# Patient Record
Sex: Male | Born: 1970 | Race: Black or African American | Hispanic: No | Marital: Married | Smoking: Never smoker
Health system: Southern US, Community
[De-identification: ages and names within clinical notes are randomized; demographics above are authoritative.]

## PROBLEM LIST (undated history)

## (undated) DIAGNOSIS — F419 Anxiety disorder, unspecified: Secondary | ICD-10-CM

## (undated) DIAGNOSIS — T7840XA Allergy, unspecified, initial encounter: Secondary | ICD-10-CM

## (undated) DIAGNOSIS — J45909 Unspecified asthma, uncomplicated: Principal | ICD-10-CM

## (undated) DIAGNOSIS — K219 Gastro-esophageal reflux disease without esophagitis: Secondary | ICD-10-CM

## (undated) DIAGNOSIS — I10 Essential (primary) hypertension: Secondary | ICD-10-CM

## (undated) DIAGNOSIS — R51 Headache: Secondary | ICD-10-CM

## (undated) DIAGNOSIS — L259 Unspecified contact dermatitis, unspecified cause: Secondary | ICD-10-CM

## (undated) HISTORY — DX: Unspecified asthma, uncomplicated: J45.909

## (undated) HISTORY — DX: Gastro-esophageal reflux disease without esophagitis: K21.9

## (undated) HISTORY — DX: Anxiety disorder, unspecified: F41.9

## (undated) HISTORY — DX: Unspecified contact dermatitis, unspecified cause: L25.9

## (undated) HISTORY — PX: WISDOM TOOTH EXTRACTION: SHX21

## (undated) HISTORY — DX: Headache: R51

## (undated) HISTORY — DX: Essential (primary) hypertension: I10

## (undated) HISTORY — DX: Allergy, unspecified, initial encounter: T78.40XA

---

## 1997-09-23 ENCOUNTER — Ambulatory Visit (HOSPITAL_BASED_OUTPATIENT_CLINIC_OR_DEPARTMENT_OTHER): Admission: RE | Admit: 1997-09-23 | Discharge: 1997-09-23 | Payer: Self-pay | Admitting: *Deleted

## 2001-08-29 ENCOUNTER — Encounter: Payer: Self-pay | Admitting: Emergency Medicine

## 2001-08-29 ENCOUNTER — Encounter: Admission: RE | Admit: 2001-08-29 | Discharge: 2001-08-29 | Payer: Self-pay | Admitting: Emergency Medicine

## 2008-02-29 ENCOUNTER — Ambulatory Visit: Payer: Self-pay | Admitting: Internal Medicine

## 2008-02-29 DIAGNOSIS — J45909 Unspecified asthma, uncomplicated: Secondary | ICD-10-CM | POA: Insufficient documentation

## 2008-02-29 DIAGNOSIS — N509 Disorder of male genital organs, unspecified: Secondary | ICD-10-CM | POA: Insufficient documentation

## 2008-02-29 HISTORY — DX: Unspecified asthma, uncomplicated: J45.909

## 2008-03-05 ENCOUNTER — Encounter: Admission: RE | Admit: 2008-03-05 | Discharge: 2008-03-05 | Payer: Self-pay | Admitting: Internal Medicine

## 2009-06-20 ENCOUNTER — Ambulatory Visit: Payer: Self-pay | Admitting: Internal Medicine

## 2009-06-20 DIAGNOSIS — L259 Unspecified contact dermatitis, unspecified cause: Secondary | ICD-10-CM | POA: Insufficient documentation

## 2009-06-20 HISTORY — DX: Unspecified contact dermatitis, unspecified cause: L25.9

## 2009-06-23 ENCOUNTER — Emergency Department (HOSPITAL_COMMUNITY): Admission: EM | Admit: 2009-06-23 | Discharge: 2009-06-23 | Payer: Self-pay | Admitting: Family Medicine

## 2009-06-25 ENCOUNTER — Ambulatory Visit: Payer: Self-pay | Admitting: Internal Medicine

## 2009-06-25 DIAGNOSIS — R51 Headache: Secondary | ICD-10-CM | POA: Insufficient documentation

## 2009-06-25 DIAGNOSIS — R519 Headache, unspecified: Secondary | ICD-10-CM | POA: Insufficient documentation

## 2009-06-25 HISTORY — DX: Headache: R51

## 2009-06-25 LAB — CONVERTED CEMR LAB
Basophils Absolute: 0 10*3/uL (ref 0.0–0.1)
Basophils Relative: 0.3 % (ref 0.0–3.0)
Eosinophils Absolute: 0 10*3/uL (ref 0.0–0.7)
Eosinophils Relative: 0.2 % (ref 0.0–5.0)
HCT: 45.7 % (ref 39.0–52.0)
Hemoglobin: 15.8 g/dL (ref 13.0–17.0)
Lymphocytes Relative: 15.6 % (ref 12.0–46.0)
Lymphs Abs: 1.6 10*3/uL (ref 0.7–4.0)
MCHC: 34.5 g/dL (ref 30.0–36.0)
MCV: 94.2 fL (ref 78.0–100.0)
Monocytes Absolute: 0.8 10*3/uL (ref 0.1–1.0)
Monocytes Relative: 7.3 % (ref 3.0–12.0)
Neutro Abs: 8 10*3/uL — ABNORMAL HIGH (ref 1.4–7.7)
Neutrophils Relative %: 76.6 % (ref 43.0–77.0)
Platelets: 216 10*3/uL (ref 150.0–400.0)
RBC: 4.85 M/uL (ref 4.22–5.81)
RDW: 14 % (ref 11.5–14.6)
WBC: 10.5 10*3/uL (ref 4.5–10.5)

## 2010-02-10 NOTE — Assessment & Plan Note (Signed)
Summary: fever/dizzy/headache/cb   Vital Signs:  Patient profile:   40 year old male Weight:      225 pounds Temp:     98.8 degrees F oral BP sitting:   140 / 80  (right arm) Cuff size:   large  Vitals Entered By: Duard Brady LPN (June 25, 2009 10:11 AM) CC: c/o increase of headache pain  ??migraine Is Patient Diabetic? No   CC:  c/o increase of headache pain  ??migraine.  History of Present Illness: 40 year old patient who presents with a two-day history of headache.  This began at noon time two days ago and has been associated intermittent fever.  He has been using Tylenol and ibuprofen.  This morning had a very difficult time putting his shoes on due to the headaches.  Headaches are frontal and vertex area and seemed to be aggravated by movement.  He denies much delay of stiff neck.  No URI symptoms.  He does have a history of asthma, which has been stable.  At the present time.  He is afebrile and has no pain at rest  Preventive Screening-Counseling & Management  Alcohol-Tobacco     Smoking Status: never  Allergies (verified): No Known Drug Allergies  Past History:  Past Medical History: Reviewed history from 02/29/2008 and no changes required. Asthma testicular nodule  Review of Systems       The patient complains of fever and headaches.  The patient denies anorexia, weight loss, weight gain, vision loss, decreased hearing, hoarseness, chest pain, syncope, dyspnea on exertion, peripheral edema, prolonged cough, hemoptysis, abdominal pain, melena, hematochezia, severe indigestion/heartburn, hematuria, incontinence, genital sores, muscle weakness, suspicious skin lesions, transient blindness, difficulty walking, depression, unusual weight change, abnormal bleeding, enlarged lymph nodes, angioedema, breast masses, and testicular masses.    Physical Exam  General:  no acute distress.  No pain at present vital signs are stable.  Blood pressure 130/82 Head:   Normocephalic and atraumatic without obvious abnormalities. No apparent alopecia or balding. Eyes:  No corneal or conjunctival inflammation noted. EOMI. Perrla. Funduscopic exam benign, without hemorrhages, exudates or papilledema. Vision grossly normal. Ears:  External ear exam shows no significant lesions or deformities.  Otoscopic examination reveals clear canals, tympanic membranes are intact bilaterally without bulging, retraction, inflammation or discharge. Hearing is grossly normal bilaterally. Nose:  External nasal examination shows no deformity or inflammation. Nasal mucosa are pink and moist without lesions or exudates. Mouth:  Oral mucosa and oropharynx without lesions or exudates.  Teeth in good repair. Neck:  No deformities, masses, or tenderness noted. supple with full range of motion.  No meningismus Lungs:  Normal respiratory effort, chest expands symmetrically. Lungs are clear to auscultation, no crackles or wheezes. Heart:  Normal rate and regular rhythm. S1 and S2 normal without gallop, murmur, click, rub or other extra sounds. Abdomen:  Bowel sounds positive,abdomen soft and non-tender without masses, organomegaly or hernias noted.   Impression & Recommendations:  Problem # 1:  HEADACHE (ICD-784.0)  His updated medication list for this problem includes:    Diclofenac Sodium 75 Mg Tbec (Diclofenac sodium) ..... One twice daily probably related to viral syndrome.  Very little to suggest meningitis even a viral meningitis, but fever bothersome.  Will treat symptomatically and follow closely clinically.  Will check a CBC    His updated medication list for this problem includes:    Diclofenac Sodium 75 Mg Tbec (Diclofenac sodium) ..... One twice daily  Problem # 2:  ASTHMA (ICD-493.90)  His updated  medication list for this problem includes:    Proair Hfa 108 (90 Base) Mcg/act Aers (Albuterol sulfate) .Marland Kitchen..Marland Kitchen Two puffs every 6 hours as needed for asthma control  His updated  medication list for this problem includes:    Proair Hfa 108 (90 Base) Mcg/act Aers (Albuterol sulfate) .Marland Kitchen..Marland Kitchen Two puffs every 6 hours as needed for asthma control  Complete Medication List: 1)  Proair Hfa 108 (90 Base) Mcg/act Aers (Albuterol sulfate) .... Two puffs every 6 hours as needed for asthma control 2)  Diclofenac Sodium 75 Mg Tbec (Diclofenac sodium) .... One twice daily  Other Orders: Venipuncture (51884) TLB-CBC Platelet - w/Differential (85025-CBCD)  Patient Instructions: 1)  Take 650-1000mg  of Tylenol every 4-6 hours as needed for relief of pain or comfort of fever AVOID taking more than 4000mg   in a 24 hour period (can cause liver damage in higher doses). 2)  call if the headache  worsens or develop worsening fever, or stiff neck Prescriptions: DICLOFENAC SODIUM 75 MG TBEC (DICLOFENAC SODIUM) one twice daily  #14 x 2   Entered and Authorized by:   Gordy Savers  MD   Signed by:   Gordy Savers  MD on 06/25/2009   Method used:   Electronically to        [facility]  [address] [phone]* (retail)       [address]       [place], [place]  [id number]       Ph: [phone] or [phone]       Fax: [phone]   RxID:   [id number]

## 2010-02-10 NOTE — Assessment & Plan Note (Signed)
Summary: rash/njr   Vital Signs:  Patient profile:   40 year old male Weight:      228 pounds Temp:     98.0 degrees F oral BP sitting:   120 / 78  (right arm) Cuff size:   regular  Vitals Entered By: Duard Brady LPN (June 20, 2009 2:33 PM) CC: c/o rash to grion area x 1 wk - also removed a tick in same area Is Patient Diabetic? No   CC:  c/o rash to grion area x 1 wk - also removed a tick in same area.  History of Present Illness: 40 year old patient who has noted a 5 day rash involving his penile shaft.  One week prior.  He did remove a tic from the scrotal area.  He and his wife use condoms for birth control measures and he has used a new brand.  The rash is nonpruritic nonpainful and he states that he would not know its presence except visually.  It has really been unchanged since he first noted it about 5 days ago.  There is been no fever, inguinal adenopathy.  History of STD  Preventive Screening-Counseling & Management  Alcohol-Tobacco     Smoking Status: never  Allergies (verified): No Known Drug Allergies  Past History:  Past Medical History: Reviewed history from 02/29/2008 and no changes required. Asthma testicular nodule  Review of Systems       The patient complains of suspicious skin lesions.    Physical Exam  General:  overweight-appearing.  anxious normal blood pressure Abdomen:  scattered small 1-2 mm shallow nonpainful noninflamed ulcerations are noted involving the penile shaft near the head of the penis   Impression & Recommendations:  Problem # 1:  DERMATITIS (ICD-692.9) I believe this most likely represents a contact dermatitis related to the recent use of a change in his lubricated condoms; will clinically observe at this time  Complete Medication List: 1)  Proair Hfa 108 (90 Base) Mcg/act Aers (Albuterol sulfate) .... Two puffs every 6 hours as needed for asthma control  Patient Instructions: 1)  Please schedule a follow-up  appointment as needed.

## 2010-03-30 LAB — POCT URINALYSIS DIP (DEVICE)
Bilirubin Urine: NEGATIVE
Glucose, UA: NEGATIVE mg/dL
Ketones, ur: NEGATIVE mg/dL
Nitrite: NEGATIVE
Protein, ur: NEGATIVE mg/dL
Specific Gravity, Urine: 1.025 (ref 1.005–1.030)
Urobilinogen, UA: 0.2 mg/dL (ref 0.0–1.0)
pH: 6.5 (ref 5.0–8.0)

## 2010-05-14 ENCOUNTER — Encounter: Payer: Self-pay | Admitting: Internal Medicine

## 2010-05-14 ENCOUNTER — Ambulatory Visit (INDEPENDENT_AMBULATORY_CARE_PROVIDER_SITE_OTHER): Payer: 59 | Admitting: Internal Medicine

## 2010-05-14 DIAGNOSIS — J45909 Unspecified asthma, uncomplicated: Secondary | ICD-10-CM

## 2010-05-14 MED ORDER — ALBUTEROL SULFATE HFA 108 (90 BASE) MCG/ACT IN AERS: 2.0000 | INHALATION_SPRAY | Freq: Four times a day (QID) | RESPIRATORY_TRACT | Status: DC | PRN

## 2010-05-14 NOTE — Patient Instructions (Signed)
Call or return to clinic prn if these symptoms worsen or fail to improve as anticipated.

## 2010-05-14 NOTE — Progress Notes (Signed)
  Subjective:    Patient ID: Carlos Burns, male    DOB: [birth year], 40 y.o.   MRN: [id number]  HPI 40 year old patient who is seen today for followup. He has a history of fairly mild episodic asthma that is generally well controlled on when necessary rescue albuterol. He apparently has periods her in the spring and also during URI where he has lower chronic symptoms. At the present time he is doing fairly well. He states that he has used his inhaler on 3 occasions this year    Review of Systems  Constitutional: Negative for fever, chills, appetite change and fatigue.  HENT: Negative for hearing loss, ear pain, congestion, sore throat, trouble swallowing, neck stiffness, dental problem, voice change and tinnitus.   Eyes: Negative for pain, discharge and visual disturbance.  Respiratory: Negative for cough, chest tightness, wheezing and stridor.   Cardiovascular: Negative for chest pain, palpitations and leg swelling.  Gastrointestinal: Negative for nausea, vomiting, abdominal pain, diarrhea, constipation, blood in stool and abdominal distention.  Genitourinary: Negative for urgency, hematuria, flank pain, discharge, difficulty urinating and genital sores.  Musculoskeletal: Negative for myalgias, back pain, joint swelling, arthralgias and gait problem.  Skin: Negative for rash.  Neurological: Negative for dizziness, syncope, speech difficulty, weakness, numbness and headaches.  Hematological: Negative for adenopathy. Does not bruise/bleed easily.  Psychiatric/Behavioral: Negative for behavioral problems and dysphoric mood. The patient is not nervous/anxious.        Objective:   Physical Exam  Constitutional: He is oriented to person, place, and time. He appears well-developed.  HENT:  Head: Normocephalic.  Right Ear: External ear normal.  Left Ear: External ear normal.  Eyes: Conjunctivae and EOM are normal.  Neck: Normal range of motion.  Cardiovascular: Normal rate and normal  heart sounds.   Pulmonary/Chest: Breath sounds normal. No respiratory distress. He has no wheezes. He has no rales.  Abdominal: Bowel sounds are normal.  Musculoskeletal: Normal range of motion. He exhibits no edema and no tenderness.  Neurological: He is alert and oriented to person, place, and time.  Psychiatric: He has a normal mood and affect. His behavior is normal.          Assessment & Plan:   Asthma stable. His albuterol was refilled. He was given samples of Flovent to use sporadically for increase in seasonal symptoms

## 2010-05-25 ENCOUNTER — Inpatient Hospital Stay (INDEPENDENT_AMBULATORY_CARE_PROVIDER_SITE_OTHER)
Admission: RE | Admit: 2010-05-25 | Discharge: 2010-05-25 | Disposition: A | Discharge status: Home / Self Care | Payer: 59 | Source: Ambulatory Visit

## 2010-05-25 DIAGNOSIS — J019 Acute sinusitis, unspecified: Secondary | ICD-10-CM

## 2011-06-29 ENCOUNTER — Encounter: Payer: Self-pay | Admitting: Internal Medicine

## 2011-06-29 ENCOUNTER — Ambulatory Visit (INDEPENDENT_AMBULATORY_CARE_PROVIDER_SITE_OTHER): Payer: 59 | Admitting: Internal Medicine

## 2011-06-29 VITALS — BP 126/70 | Wt 250.0 lb

## 2011-06-29 DIAGNOSIS — J45909 Unspecified asthma, uncomplicated: Secondary | ICD-10-CM

## 2011-06-29 MED ORDER — ALBUTEROL SULFATE HFA 108 (90 BASE) MCG/ACT IN AERS: 2.0000 | INHALATION_SPRAY | Freq: Four times a day (QID) | RESPIRATORY_TRACT | Status: DC | PRN

## 2011-06-29 MED ORDER — FLUTICASONE-SALMETEROL 100-50 MCG/DOSE IN AEPB: 1.0000 | INHALATION_SPRAY | Freq: Two times a day (BID) | RESPIRATORY_TRACT | Status: DC

## 2011-06-29 MED ORDER — PREDNISONE 20 MG PO TABS: 20.0000 mg | ORAL_TABLET | Freq: Two times a day (BID) | ORAL | Status: AC

## 2011-06-29 NOTE — Progress Notes (Signed)
  Subjective:    Patient ID: Carlos Burns, male    DOB: Feb 13, 1970, 41 y.o.   MRN: [id number]  HPI  41 year old patient who has a history of asthma. He presents with a two-week history of increasing shortness of breath especially late in the afternoon and early evening. His chest tightness and shortness of breath alleviated by albuterol. He does note some occasional wheezing at night    Review of Systems  Constitutional: Negative for fever, chills, appetite change and fatigue.  HENT: Negative for hearing loss, ear pain, congestion, sore throat, trouble swallowing, neck stiffness, dental problem, voice change and tinnitus.   Eyes: Negative for pain, discharge and visual disturbance.  Respiratory: Positive for chest tightness and wheezing. Negative for cough and stridor.   Cardiovascular: Negative for chest pain, palpitations and leg swelling.  Gastrointestinal: Negative for nausea, vomiting, abdominal pain, diarrhea, constipation, blood in stool and abdominal distention.  Genitourinary: Negative for urgency, hematuria, flank pain, discharge, difficulty urinating and genital sores.  Musculoskeletal: Negative for myalgias, back pain, joint swelling, arthralgias and gait problem.  Skin: Negative for rash.  Neurological: Negative for dizziness, syncope, speech difficulty, weakness, numbness and headaches.  Hematological: Negative for adenopathy. Does not bruise/bleed easily.  Psychiatric/Behavioral: Negative for behavioral problems and dysphoric mood. The patient is not nervous/anxious.        Objective:   Physical Exam  Constitutional: He is oriented to person, place, and time. He appears well-developed.  HENT:  Head: Normocephalic.  Right Ear: External ear normal.  Left Ear: External ear normal.  Eyes: Conjunctivae and EOM are normal.  Neck: Normal range of motion.  Cardiovascular: Normal rate and normal heart sounds.   Pulmonary/Chest: Effort normal and breath sounds normal. No  respiratory distress. He has no wheezes. He has no rales.       O2 saturation 98%  Abdominal: Bowel sounds are normal.  Musculoskeletal: Normal range of motion. He exhibits no edema and no tenderness.  Neurological: He is alert and oriented to person, place, and time.  Psychiatric: He has a normal mood and affect. His behavior is normal.          Assessment & Plan:   Mild persistent asthma. We'll treat with prednisone for 5 days. We'll resume Advair continue albuterol when necessary. Will call if unimproved or incomplete symptom control

## 2011-06-29 NOTE — Patient Instructions (Signed)
Call or return to clinic prn if these symptoms worsen or fail to improve as anticipated.

## 2011-10-13 ENCOUNTER — Encounter: Payer: Self-pay | Admitting: Family Medicine

## 2011-10-13 ENCOUNTER — Ambulatory Visit (INDEPENDENT_AMBULATORY_CARE_PROVIDER_SITE_OTHER): Payer: 59 | Admitting: Family Medicine

## 2011-10-13 VITALS — BP 146/94 | HR 93 | Temp 98.6°F | Wt 246.0 lb

## 2011-10-13 DIAGNOSIS — I10 Essential (primary) hypertension: Secondary | ICD-10-CM

## 2011-10-13 LAB — BASIC METABOLIC PANEL
BUN: 13 mg/dL (ref 6–23)
Creatinine, Ser: 1.2 mg/dL (ref 0.4–1.5)
GFR: 84.74 mL/min (ref >60.00)
Potassium: 3.5 mEq/L (ref 3.5–5.1)

## 2011-10-13 LAB — CBC WITH DIFFERENTIAL/PLATELET
Basophils Relative: 0.4 % (ref 0.0–3.0)
Hemoglobin: 15.3 g/dL (ref 13.0–17.0)
Lymphocytes Relative: 19.1 % (ref 12.0–46.0)
Monocytes Relative: 7.3 % (ref 3.0–12.0)
Neutro Abs: 6.3 10*3/uL (ref 1.4–7.7)
RBC: 4.93 Mil/uL (ref 4.22–5.81)

## 2011-10-13 LAB — POCT URINALYSIS DIPSTICK
Bilirubin, UA: NEGATIVE
Ketones, UA: NEGATIVE
Leukocytes, UA: NEGATIVE

## 2011-10-13 LAB — HEPATIC FUNCTION PANEL
AST: 25 U/L (ref 0–37)
Total Bilirubin: 0.7 mg/dL (ref 0.3–1.2)

## 2011-10-13 MED ORDER — LISINOPRIL-HYDROCHLOROTHIAZIDE 10-12.5 MG PO TABS: 1.0000 | ORAL_TABLET | Freq: Every day | ORAL | Status: DC

## 2011-10-13 NOTE — Progress Notes (Signed)
  Subjective:    Patient ID: Carlos Burns, male    DOB: [birth year], 41 y.o.   MRN: [id number]  HPI Here for elevated BP readings and some symptoms. He has had high BP readings off and on in the past, but this was normal when he was here last June. [place] [employer] [place] this [place], and his initial BP was 160/100 in one arm and 170/110 in the other arm. He admits to having some intermittent lightheadedness and HAs for the past few months. He also described tingling in both hands and both feet. No chest pains or SOB. His family hx is positive for HTN and diabetes in his parents and multiple relatives. He does not use tobacco. After he rested for awhile at the fair this morning, his repeat pressures were in the range of 150s over 90s.    Review of Systems  Constitutional: Negative.   Eyes: Negative.   Respiratory: Negative.   Cardiovascular: Negative.   Neurological: Positive for light-headedness, numbness and headaches. Negative for dizziness.       Objective:   Physical Exam  Constitutional: He is oriented to person, place, and time. No distress.       Overweight   Neck: No thyromegaly present.  Cardiovascular: Normal rate, regular rhythm, normal heart sounds and intact distal pulses.  Exam reveals no gallop and no friction rub.   No murmur heard. Pulmonary/Chest: Effort normal and breath sounds normal. No respiratory distress. He has no wheezes. He has no rales.  Musculoskeletal: He exhibits no edema.  Lymphadenopathy:    He has no cervical adenopathy.  Neurological: He is alert and oriented to person, place, and time.          Assessment & Plan:  New diagnosis of HTN. Start on Lisinopril HCT. Get labs today. We discussed reducing his sodium intake, getting more exercise, and losing weight. Recheck with Dr. Amador Cunas in one month.

## 2011-10-14 LAB — TSH: TSH: 2.66 u[IU]/mL (ref 0.35–5.50)

## 2011-10-15 NOTE — Progress Notes (Signed)
Quick Note:  I left voice message with normal results. ______ 

## 2012-02-26 ENCOUNTER — Other Ambulatory Visit: Payer: Self-pay

## 2012-11-16 ENCOUNTER — Other Ambulatory Visit: Payer: Self-pay

## 2012-11-21 ENCOUNTER — Ambulatory Visit (INDEPENDENT_AMBULATORY_CARE_PROVIDER_SITE_OTHER): Payer: 59 | Admitting: Internal Medicine

## 2012-11-21 ENCOUNTER — Encounter: Payer: Self-pay | Admitting: Internal Medicine

## 2012-11-21 VITALS — BP 150/100 | HR 81 | Temp 98.2°F | Resp 20 | Wt 246.0 lb

## 2012-11-21 DIAGNOSIS — M5010 Cervical disc disorder with radiculopathy, unspecified cervical region: Secondary | ICD-10-CM

## 2012-11-21 DIAGNOSIS — M5412 Radiculopathy, cervical region: Secondary | ICD-10-CM

## 2012-11-21 DIAGNOSIS — I10 Essential (primary) hypertension: Secondary | ICD-10-CM | POA: Insufficient documentation

## 2012-11-21 MED ORDER — METHYLPREDNISOLONE ACETATE 80 MG/ML IJ SUSP
80.0000 mg | Freq: Once | INTRAMUSCULAR | Status: AC
  Administered 2012-11-21: 80 mg via INTRAMUSCULAR

## 2012-11-21 MED ORDER — LISINOPRIL-HYDROCHLOROTHIAZIDE 10-12.5 MG PO TABS: 1.0000 | ORAL_TABLET | Freq: Every day | ORAL | Status: DC

## 2012-11-21 NOTE — Addendum Note (Signed)
Addended by: Jimmye Norman on: 11/21/2012 05:30 PM   Modules accepted: Orders

## 2012-11-21 NOTE — Patient Instructions (Addendum)
Limit your sodium (Salt) intake  Cervical Strain and Sprain (Whiplash) with Rehab Cervical strain and sprains are injuries that commonly occur with "whiplash" injuries. Whiplash occurs when the neck is forcefully whipped backward or forward, such as during a motor vehicle accident. The muscles, ligaments, tendons, discs and nerves of the neck are susceptible to injury when this occurs. SYMPTOMS   Pain or stiffness in the front and/or back of neck  Symptoms may present immediately or up to 24 hours after injury.  Dizziness, headache, nausea and vomiting.  Muscle spasm with soreness and stiffness in the neck.  Tenderness and swelling at the injury site. CAUSES  Whiplash injuries often occur during contact sports or motor vehicle accidents.  RISK INCREASES WITH:  Osteoarthritis of the spine.  Situations that make head or neck accidents or trauma more likely.  High-risk sports (football, rugby, wrestling, hockey, auto racing, gymnastics, diving, contact karate or boxing).  Poor strength and flexibility of the neck.  Previous neck injury.  Poor tackling technique.  Improperly fitted or padded equipment. PREVENTION  Learn and use proper technique (avoid tackling with the head, spearing and head-butting; use proper falling techniques to avoid landing on the head).  Warm up and stretch properly before activity.  Maintain physical fitness:  Strength, flexibility and endurance.  Cardiovascular fitness.  Wear properly fitted and padded protective equipment, such as padded soft collars, for participation in contact sports. PROGNOSIS  Recovery for cervical strain and sprain injuries is dependent on the extent of the injury. These injuries are usually curable in 1 week to 3 months with appropriate treatment.  RELATED COMPLICATIONS   Temporary numbness and weakness may occur if the nerve roots are damaged, and this may persist until the nerve has completely healed.  Chronic pain  due to frequent recurrence of symptoms.  Prolonged healing, especially if activity is resumed too soon (before complete recovery). TREATMENT  Treatment initially involves the use of ice and medication to help reduce pain and inflammation. It is also important to perform strengthening and stretching exercises and modify activities that worsen symptoms so the injury does not get worse. These exercises may be performed at home or with a therapist. For patients who experience severe symptoms, a soft padded collar may be recommended to be worn around the neck.  Improving your posture may help reduce symptoms. Posture improvement includes pulling your chin and abdomen in while sitting or standing. If you are sitting, sit in a firm chair with your buttocks against the back of the chair. While sleeping, try replacing your pillow with a small towel rolled to 2 inches in diameter, or use a cervical pillow or soft cervical collar. Poor sleeping positions delay healing.  For patients with nerve root damage, which causes numbness or weakness, the use of a cervical traction apparatus may be recommended. Surgery is rarely necessary for these injuries. However, cervical strain and sprains that are present at birth (congenital) may require surgery. MEDICATION   If pain medication is necessary, nonsteroidal anti-inflammatory medications, such as aspirin and ibuprofen, or other minor pain relievers, such as acetaminophen, are often recommended.  Do not take pain medication for 7 days before surgery.  Prescription pain relievers may be given if deemed necessary by your caregiver. Use only as directed and only as much as you need. HEAT AND COLD:   Cold treatment (icing) relieves pain and reduces inflammation. Cold treatment should be applied for 10 to 15 minutes every 2 to 3 hours for inflammation and pain  and immediately after any activity that aggravates your symptoms. Use ice packs or an ice massage.  Heat treatment  may be used prior to performing the stretching and strengthening activities prescribed by your caregiver, physical therapist, or athletic trainer. Use a heat pack or a warm soak. SEEK MEDICAL CARE IF:   Symptoms get worse or do not improve in 2 weeks despite treatment.  New, unexplained symptoms develop (drugs used in treatment may produce side effects). EXERCISES RANGE OF MOTION (ROM) AND STRETCHING EXERCISES - Cervical Strain and Sprain These exercises may help you when beginning to rehabilitate your injury. In order to successfully resolve your symptoms, you must improve your posture. These exercises are designed to help reduce the forward-head and rounded-shoulder posture which contributes to this condition. Your symptoms may resolve with or without further involvement from your physician, physical therapist or athletic trainer. While completing these exercises, remember:   Restoring tissue flexibility helps normal motion to return to the joints. This allows healthier, less painful movement and activity.  An effective stretch should be held for at least 20 seconds, although you may need to begin with shorter hold times for comfort.  A stretch should never be painful. You should only feel a gentle lengthening or release in the stretched tissue. STRETCH- Axial Extensors  Lie on your back on the floor. You may bend your knees for comfort. Place a rolled up hand towel or dish towel, about 2 inches in diameter, under the part of your head that makes contact with the floor.  Gently tuck your chin, as if trying to make a "double chin," until you feel a gentle stretch at the base of your head.  Hold __________ seconds. Repeat __________ times. Complete this exercise __________ times per day.  STRETECH - Axial Extension   Stand or sit on a firm surface. Assume a good posture: chest up, shoulders drawn back, abdominal muscles slightly tense, knees unlocked (if standing) and feet hip width  apart.  Slowly retract your chin so your head slides back and your chin slightly lowers.Continue to look straight ahead.  You should feel a gentle stretch in the back of your head. Be certain not to feel an aggressive stretch since this can cause headaches later.  Hold for __________ seconds. Repeat __________ times. Complete this exercise __________ times per day. STRETCH  Cervical Side Bend   Stand or sit on a firm surface. Assume a good posture: chest up, shoulders drawn back, abdominal muscles slightly tense, knees unlocked (if standing) and feet hip width apart.  Without letting your nose or shoulders move, slowly tip your right / left ear to your shoulder until your feel a gentle stretch in the muscles on the opposite side of your neck.  Hold __________ seconds. Repeat __________ times. Complete this exercise __________ times per day. STRETCH  Cervical Rotators   Stand or sit on a firm surface. Assume a good posture: chest up, shoulders drawn back, abdominal muscles slightly tense, knees unlocked (if standing) and feet hip width apart.  Keeping your eyes level with the ground, slowly turn your head until you feel a gentle stretch along the back and opposite side of your neck.  Hold __________ seconds. Repeat __________ times. Complete this exercise __________ times per day. RANGE OF MOTION - Neck Circles   Stand or sit on a firm surface. Assume a good posture: chest up, shoulders drawn back, abdominal muscles slightly tense, knees unlocked (if standing) and feet hip width apart.  Gently roll  your head down and around from the back of one shoulder to the back of the other. The motion should never be forced or painful.  Repeat the motion 10-20 times, or until you feel the neck muscles relax and loosen. Repeat __________ times. Complete the exercise __________ times per day. STRENGTHENING EXERCISES - Cervical Strain and Sprain These exercises may help you when beginning to  rehabilitate your injury. They may resolve your symptoms with or without further involvement from your physician, physical therapist or athletic trainer. While completing these exercises, remember:   Muscles can gain both the endurance and the strength needed for everyday activities through controlled exercises.  Complete these exercises as instructed by your physician, physical therapist or athletic trainer. Progress the resistance and repetitions only as guided.  You may experience muscle soreness or fatigue, but the pain or discomfort you are trying to eliminate should never worsen during these exercises. If this pain does worsen, stop and make certain you are following the directions exactly. If the pain is still present after adjustments, discontinue the exercise until you can discuss the trouble with your clinician. STRENGTH Cervical Flexors, Isometric  Face a wall, standing about 6 inches away. Place a small pillow, a ball about 6-8 inches in diameter, or a folded towel between your forehead and the wall.  Slightly tuck your chin and gently push your forehead into the soft object. Push only with mild to moderate intensity, building up tension gradually. Keep your jaw and forehead relaxed.  Hold 10 to 20 seconds. Keep your breathing relaxed.  Release the tension slowly. Relax your neck muscles completely before you start the next repetition. Repeat __________ times. Complete this exercise __________ times per day. STRENGTH- Cervical Lateral Flexors, Isometric   Stand about 6 inches away from a wall. Place a small pillow, a ball about 6-8 inches in diameter, or a folded towel between the side of your head and the wall.  Slightly tuck your chin and gently tilt your head into the soft object. Push only with mild to moderate intensity, building up tension gradually. Keep your jaw and forehead relaxed.  Hold 10 to 20 seconds. Keep your breathing relaxed.  Release the tension slowly. Relax  your neck muscles completely before you start the next repetition. Repeat __________ times. Complete this exercise __________ times per day. STRENGTH  Cervical Extensors, Isometric   Stand about 6 inches away from a wall. Place a small pillow, a ball about 6-8 inches in diameter, or a folded towel between the back of your head and the wall.  Slightly tuck your chin and gently tilt your head back into the soft object. Push only with mild to moderate intensity, building up tension gradually. Keep your jaw and forehead relaxed.  Hold 10 to 20 seconds. Keep your breathing relaxed.  Release the tension slowly. Relax your neck muscles completely before you start the next repetition. Repeat __________ times. Complete this exercise __________ times per day. POSTURE AND BODY MECHANICS CONSIDERATIONS - Cervical Strain and Sprain Keeping correct posture when sitting, standing or completing your activities will reduce the stress put on different body tissues, allowing injured tissues a chance to heal and limiting painful experiences. The following are general guidelines for improved posture. Your physician or physical therapist will provide you with any instructions specific to your needs. While reading these guidelines, remember:  The exercises prescribed by your provider will help you have the flexibility and strength to maintain correct postures.  The correct posture provides the  optimal environment for your joints to work. All of your joints have less wear and tear when properly supported by a spine with good posture. This means you will experience a healthier, less painful body.  Correct posture must be practiced with all of your activities, especially prolonged sitting and standing. Correct posture is as important when doing repetitive low-stress activities (typing) as it is when doing a single heavy-load activity (lifting). PROLONGED STANDING WHILE SLIGHTLY LEANING FORWARD When completing a task that  requires you to lean forward while standing in one place for a long time, place either foot up on a stationary 2-4 inch high object to help maintain the best posture. When both feet are on the ground, the low back tends to lose its slight inward curve. If this curve flattens (or becomes too large), then the back and your other joints will experience too much stress, fatigue more quickly and can cause pain.  RESTING POSITIONS Consider which positions are most painful for you when choosing a resting position. If you have pain with flexion-based activities (sitting, bending, stooping, squatting), choose a position that allows you to rest in a less flexed posture. You would want to avoid curling into a fetal position on your side. If your pain worsens with extension-based activities (prolonged standing, working overhead), avoid resting in an extended position such as sleeping on your stomach. Most people will find more comfort when they rest with their spine in a more neutral position, neither too rounded nor too arched. Lying on a non-sagging bed on your side with a pillow between your knees, or on your back with a pillow under your knees will often provide some relief. Keep in mind, being in any one position for a prolonged period of time, no matter how correct your posture, can still lead to stiffness. WALKING Walk with an upright posture. Your ears, shoulders and hips should all line-up. OFFICE WORK When working at a desk, create an environment that supports good, upright posture. Without extra support, muscles fatigue and lead to excessive strain on joints and other tissues. CHAIR:  A chair should be able to slide under your desk when your back makes contact with the back of the chair. This allows you to work closely.  The chair's height should allow your eyes to be level with the upper part of your monitor and your hands to be slightly lower than your elbows.  Body position:  Your feet should make  contact with the floor. If this is not possible, use a foot rest.  Keep your ears over your shoulders. This will reduce stress on your neck and low back. Document Released: 12/28/2004 Document Revised: 04/24/2012 Document Reviewed: 04/11/2008 [facility] Patient Information 2014 [place], [place].  CPX in January as scheduled

## 2012-11-21 NOTE — Progress Notes (Signed)
Subjective:    Patient ID: Carlos Burns, male    DOB: August 14, 1970, 42 y.o.   MRN: [id number]  HPI Pre-visit discussion using our clinic review tool. No additional management support is needed unless otherwise documented below in the visit note.    BP Readings from Last 3 Encounters:  11/21/12 150/100  10/13/11 146/94  06/29/11 55/15    42 year old patient who is seen today with a chief complaint of right arm pain and paresthesias. He states this has been present since the spring. Pain is aggravated by movement of the head and neck to the right. It also seems to be aggravated by raising the right arm. He works in [employer]ing [employer] and does considerable amount of typing.  He was placed on combination antihypertensive blood pressure medication recently but has been off the for several weeks. Blood pressure is moderately high today and blood pressure medications will be resumed  Past Medical History  Diagnosis Date  . ASTHMA 02/29/2008  . DERMATITIS 06/20/2009  . Headache(784.0) 06/25/2009    History   Social History  . Marital Status: Married    Spouse Name: N/A    Number of Children: N/A  . Years of Education: N/A   Occupational History  . Not on file.   Social History Main Topics  . Smoking status: Never Smoker   . Smokeless tobacco: Never Used  . Alcohol Use: Yes  . Drug Use: No  . Sexual Activity: Not on file   Other Topics Concern  . Not on file   Social History Narrative  . No narrative on file    History reviewed. No pertinent past surgical history.  Family History  Problem Relation Age of Onset  . Hypertension Father   . Stroke Maternal Grandmother   . Hypertension Maternal Grandmother   . Stroke Maternal Grandfather   . Diabetes Maternal Grandfather   . Stroke Paternal Grandmother   . Diabetes Paternal Grandmother   . Hypertension Paternal Grandmother   . Diabetes Paternal Grandfather   . Hypertension Paternal Grandfather     No Known Allergies  Current  Outpatient Prescriptions on File Prior to Visit  Medication Sig Dispense Refill  . albuterol (PROAIR HFA) 108 (90 BASE) MCG/ACT inhaler Inhale 2 puffs into the lungs every 6 (six) hours as needed. For asthma  1 Inhaler  6  . cetirizine (ZYRTEC) 10 MG tablet Take 10 mg by mouth as needed.       . Fluticasone-Salmeterol (ADVAIR) 100-50 MCG/DOSE AEPB Inhale 1 puff into the lungs as needed.       No current facility-administered medications on file prior to visit.    BP 150/100  Pulse 81  Temp(Src) 98.2 F (36.8 C) (Oral)  Resp 20  Wt 246 lb (111.585 kg)  SpO2 99%     Review of Systems  Constitutional: Negative for fever, chills, appetite change and fatigue.  HENT: Negative for congestion, dental problem, ear pain, hearing loss, sore throat, tinnitus, trouble swallowing and voice change.   Eyes: Negative for pain, discharge and visual disturbance.  Respiratory: Negative for cough, chest tightness, wheezing and stridor.   Cardiovascular: Negative for chest pain, palpitations and leg swelling.  Gastrointestinal: Negative for nausea, vomiting, abdominal pain, diarrhea, constipation, blood in stool and abdominal distention.  Genitourinary: Negative for urgency, hematuria, flank pain, discharge, difficulty urinating and genital sores.  Musculoskeletal: Negative for arthralgias, back pain, gait problem, joint swelling, myalgias and neck stiffness.  Skin: Negative for rash.  Neurological: Positive for numbness. Negative  for dizziness, syncope, speech difficulty, weakness and headaches.  Hematological: Negative for adenopathy. Does not bruise/bleed easily.  Psychiatric/Behavioral: Negative for behavioral problems and dysphoric mood. The patient is not nervous/anxious.        Objective:   Physical Exam  Constitutional: He is oriented to person, place, and time. He appears well-developed and well-nourished. No distress.  Blood pressure 150/100  HENT:  Head: Normocephalic.  Right Ear:  External ear normal.  Left Ear: External ear normal.  Eyes: Conjunctivae and EOM are normal.  Neck: Normal range of motion.  Cardiovascular: Normal rate and normal heart sounds.   Pulmonary/Chest: Breath sounds normal.  Abdominal: Bowel sounds are normal.  Musculoskeletal: Normal range of motion. He exhibits no edema and no tenderness.  Movement of head neck to the right did tend to aggravate the paresthesias involving his right lower arm Grip strength was normal on flexion and extension normal Biceps and triceps reflexes normal  Negative Tinel's  Neurological: He is alert and oriented to person, place, and time.  Psychiatric: He has a normal mood and affect. His behavior is normal.          Assessment & Plan:   Right arm and shoulder pain with paresthesias of the right lower arm and  involving the second and third fingers suspect mild cervical radiculopathy. We'll treat with Depo-Medrol and anti-inflammatory medications  Hypertension poor control. Will resume on antihypertensive medications. The patient is scheduled for a physical in 2 months we'll reassess at that time;

## 2013-01-18 ENCOUNTER — Other Ambulatory Visit (INDEPENDENT_AMBULATORY_CARE_PROVIDER_SITE_OTHER): Payer: 59

## 2013-01-18 DIAGNOSIS — Z0000 Encounter for general adult medical examination without abnormal findings: Secondary | ICD-10-CM

## 2013-01-18 LAB — CBC WITH DIFFERENTIAL/PLATELET
BASOS PCT: 0.1 % (ref 0.0–3.0)
Basophils Absolute: 0 10*3/uL (ref 0.0–0.1)
EOS ABS: 0 10*3/uL (ref 0.0–0.7)
Eosinophils Relative: 0.3 % (ref 0.0–5.0)
HEMATOCRIT: 42 % (ref 39.0–52.0)
HEMOGLOBIN: 14.4 g/dL (ref 13.0–17.0)
Lymphocytes Relative: 12.6 % (ref 12.0–46.0)
Lymphs Abs: 1.4 10*3/uL (ref 0.7–4.0)
MCHC: 34.4 g/dL (ref 30.0–36.0)
MCV: 90.4 fl (ref 78.0–100.0)
Monocytes Absolute: 0.7 10*3/uL (ref 0.1–1.0)
Monocytes Relative: 6.2 % (ref 3.0–12.0)
NEUTROS ABS: 8.7 10*3/uL — AB (ref 1.4–7.7)
Neutrophils Relative %: 80.8 % — ABNORMAL HIGH (ref 43.0–77.0)
Platelets: 236 10*3/uL (ref 150.0–400.0)
RBC: 4.64 Mil/uL (ref 4.22–5.81)
RDW: 13.9 % (ref 11.5–14.6)
WBC: 10.8 10*3/uL — ABNORMAL HIGH (ref 4.5–10.5)

## 2013-01-18 LAB — POCT URINALYSIS DIPSTICK
Bilirubin, UA: NEGATIVE
GLUCOSE UA: NEGATIVE
Ketones, UA: NEGATIVE
Leukocytes, UA: NEGATIVE
NITRITE UA: NEGATIVE
PROTEIN UA: NEGATIVE
SPEC GRAV UA: 1.02
Urobilinogen, UA: 0.2
pH, UA: 7

## 2013-01-18 LAB — TSH: TSH: 1.27 u[IU]/mL (ref 0.35–5.50)

## 2013-01-18 LAB — HEPATIC FUNCTION PANEL
ALBUMIN: 3.7 g/dL (ref 3.5–5.2)
ALT: 27 U/L (ref 0–53)
AST: 18 U/L (ref 0–37)
Alkaline Phosphatase: 96 U/L (ref 39–117)
Bilirubin, Direct: 0.1 mg/dL (ref 0.0–0.3)
TOTAL PROTEIN: 7 g/dL (ref 6.0–8.3)
Total Bilirubin: 0.5 mg/dL (ref 0.3–1.2)

## 2013-01-18 LAB — LIPID PANEL
CHOL/HDL RATIO: 4
Cholesterol: 170 mg/dL (ref 0–200)
HDL: 41.1 mg/dL (ref >39.00)
LDL CALC: 115 mg/dL — AB (ref 0–99)
Triglycerides: 72 mg/dL (ref 0.0–149.0)
VLDL: 14.4 mg/dL (ref 0.0–40.0)

## 2013-01-18 LAB — BASIC METABOLIC PANEL
BUN: 13 mg/dL (ref 6–23)
CHLORIDE: 103 meq/L (ref 96–112)
CO2: 29 meq/L (ref 19–32)
Calcium: 8.8 mg/dL (ref 8.4–10.5)
Creatinine, Ser: 1.2 mg/dL (ref 0.4–1.5)
GFR: 85.86 mL/min (ref >60.00)
GLUCOSE: 78 mg/dL (ref 70–99)
POTASSIUM: 4 meq/L (ref 3.5–5.1)
Sodium: 139 mEq/L (ref 135–145)

## 2013-01-18 LAB — PSA: PSA: 0.71 ng/mL (ref 0.10–4.00)

## 2013-01-25 ENCOUNTER — Encounter: Payer: Self-pay | Admitting: Internal Medicine

## 2013-01-25 ENCOUNTER — Ambulatory Visit (INDEPENDENT_AMBULATORY_CARE_PROVIDER_SITE_OTHER): Payer: 59 | Admitting: Internal Medicine

## 2013-01-25 VITALS — BP 130/84 | HR 67 | Temp 98.2°F | Resp 20 | Ht 70.5 in | Wt 244.0 lb

## 2013-01-25 DIAGNOSIS — I10 Essential (primary) hypertension: Secondary | ICD-10-CM

## 2013-01-25 DIAGNOSIS — J45909 Unspecified asthma, uncomplicated: Secondary | ICD-10-CM

## 2013-01-25 DIAGNOSIS — Z23 Encounter for immunization: Secondary | ICD-10-CM

## 2013-01-25 DIAGNOSIS — Z0000 Encounter for general adult medical examination without abnormal findings: Secondary | ICD-10-CM

## 2013-01-25 NOTE — Progress Notes (Signed)
Subjective:    Patient ID: Carlos Burns, male    DOB: [birth year], 43 y.o.   MRN: [id number]  HPI  43 year old patient who is in today for a health maintenance exam. Medical problems include hypertension and chronic stable asthma. He has done well on maintenance medication. He has resumed antihypertensive medication and blood pressure has been better controlled.  Current Allergies:  No known allergies   Past Medical History:   Asthma  testicular nodule   Past Surgical History:  wisdom teeth, extraction   Family History:  Reviewed history and no changes required:  father age 40 history of hypertension  mother, age 24  in good health  Three of 4 grandparents deceased; positive for cerebrovascular disease, diabetes, and hypertension  One sister two brothers in good health-sister. History of systolic murmur   Social History:   Remarried. 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  HENT: Negative for congestion, dental problem, ear pain, hearing loss, mouth sores, rhinorrhea, sinus pressure, sneezing, tinnitus, trouble swallowing and voice change.   Eyes: Negative for photophobia, pain, redness and visual disturbance.  Respiratory: Negative for apnea, cough, choking, chest tightness, shortness of breath and wheezing.   Cardiovascular: Negative for chest pain, palpitations and leg swelling.  Gastrointestinal: Negative for nausea, vomiting, abdominal pain, diarrhea, constipation, blood in stool, abdominal distention, anal bleeding and rectal pain.  Genitourinary: Negative for dysuria, urgency, frequency, hematuria, flank pain, decreased urine volume, discharge, penile swelling, scrotal swelling, difficulty urinating, genital sores and testicular pain.  Musculoskeletal: Negative for  arthralgias, back pain, gait problem, joint swelling, myalgias, neck pain and neck stiffness.  Skin: Negative for color change, rash and wound.  Neurological: Negative for dizziness, tremors, seizures, syncope, facial asymmetry, speech difficulty, weakness, light-headedness, numbness and headaches.  Hematological: Negative for adenopathy. Does not bruise/bleed easily.  Psychiatric/Behavioral: Negative for suicidal ideas, hallucinations, behavioral problems, confusion, sleep disturbance, self-injury, dysphoric mood, decreased concentration and agitation. The patient is not nervous/anxious.        Objective:   Physical Exam  Constitutional: He appears well-developed and well-nourished.  HENT:  Head: Normocephalic and atraumatic.  Right Ear: External ear normal.  Left Ear: External ear normal.  Nose: Nose normal.  Mouth/Throat: Oropharynx is clear and moist.  Eyes: Conjunctivae and EOM are normal. Pupils are equal, round, and reactive to light. No scleral icterus.  Neck: Normal range of motion. Neck supple. No JVD present. No thyromegaly present.  Cardiovascular: Regular rhythm, normal heart sounds and intact distal pulses.  Exam reveals no gallop and no friction rub.   No murmur heard. Pulmonary/Chest: Effort normal and breath sounds normal. He exhibits no tenderness.  Abdominal: Soft. Bowel sounds are normal. He exhibits no distension and no mass. There is no tenderness.  Genitourinary: Penis normal.  Musculoskeletal: Normal range of motion. He exhibits no edema and no tenderness.  Lymphadenopathy:    He has no cervical adenopathy.  Neurological: He is alert. He has normal reflexes. No cranial nerve deficit. Coordination normal.  Skin: Skin is warm and dry. No rash noted.  Psychiatric: He has a normal mood and affect. His behavior is normal.          Assessment & Plan:    Preventive health exam Asthma stable Hypertension stable Mild exogenous obesity. Exercise regimen weight  loss  encouraged  Goals all not recommended. Home blood pressure monitor and recommended. Recheck one year

## 2013-01-25 NOTE — Patient Instructions (Signed)

## 2013-01-25 NOTE — Progress Notes (Signed)
Pre-visit discussion using our clinic review tool. No additional management support is needed unless otherwise documented below in the visit note.  

## 2013-01-25 NOTE — Addendum Note (Signed)
Addended by: Jimmye NormanPHANOS, Qunisha Bryk J on: 01/25/2013 04:46 PM   Modules accepted: Orders

## 2013-02-14 ENCOUNTER — Telehealth: Payer: Self-pay | Admitting: Internal Medicine

## 2013-02-14 NOTE — Telephone Encounter (Signed)
Relevant patient education assigned to patient using Emmi. ° °

## 2013-11-22 ENCOUNTER — Other Ambulatory Visit: Payer: Self-pay | Admitting: Internal Medicine

## 2014-01-17 ENCOUNTER — Encounter: Payer: Self-pay | Admitting: Family Medicine

## 2014-01-17 ENCOUNTER — Ambulatory Visit (INDEPENDENT_AMBULATORY_CARE_PROVIDER_SITE_OTHER): Payer: 59 | Admitting: Family Medicine

## 2014-01-17 VITALS — BP 122/88 | HR 75 | Temp 98.5°F | Ht 70.5 in | Wt 245.8 lb

## 2014-01-17 DIAGNOSIS — J069 Acute upper respiratory infection, unspecified: Secondary | ICD-10-CM

## 2014-01-17 NOTE — Progress Notes (Signed)
HPI:  URI -started: about 7 days ago -symptoms:nasal congestion, sore throat, mild cough, PND -denies:fever, SOB, NVD, tooth pain, sinus pain, asthma symptoms -has tried: nothing -sick contacts/travel/risks: denies flu exposure, tick exposure or or Ebola risks  ROS: See pertinent positives and negatives per HPI.  Past Medical History  Diagnosis Date  . ASTHMA 02/29/2008  . DERMATITIS 06/20/2009  . Headache(784.0) 06/25/2009    No past surgical history on file.  Family History  Problem Relation Age of Onset  . Hypertension Father   . Stroke Maternal Grandmother   . Hypertension Maternal Grandmother   . Stroke Maternal Grandfather   . Diabetes Maternal Grandfather   . Stroke Paternal Grandmother   . Diabetes Paternal Grandmother   . Hypertension Paternal Grandmother   . Diabetes Paternal Grandfather   . Hypertension Paternal Grandfather     History   Social History  . Marital Status: Married    Spouse Name: N/A    Number of Children: N/A  . Years of Education: N/A   Social History Main Topics  . Smoking status: Never Smoker   . Smokeless tobacco: Never Used  . Alcohol Use: Yes  . Drug Use: No  . Sexual Activity: None   Other Topics Concern  . None   Social History Narrative    Current outpatient prescriptions: albuterol (PROAIR HFA) 108 (90 BASE) MCG/ACT inhaler, Inhale 2 puffs into the lungs every 6 (six) hours as needed. For asthma, Disp: 1 Inhaler, Rfl: 6;  cetirizine (ZYRTEC) 10 MG tablet, Take 10 mg by mouth as needed. , Disp: , Rfl: ;  Fluticasone-Salmeterol (ADVAIR) 100-50 MCG/DOSE AEPB, Inhale 1 puff into the lungs as needed., Disp: , Rfl:  lisinopril-hydrochlorothiazide (PRINZIDE,ZESTORETIC) 10-12.5 MG per tablet, TAKE 1 TABLET BY MOUTH DAILY., Disp: 90 tablet, Rfl: 0  EXAM:  Filed Vitals:   01/17/14 1037  BP: 122/88  Pulse: 75  Temp: 98.5 F (36.9 C)    Body mass index is 34.76 kg/(m^2).  GENERAL: vitals reviewed and listed above, alert,  oriented, appears well hydrated and in no acute distress  HEENT: atraumatic, conjunttiva clear, no obvious abnormalities on inspection of external nose and ears, normal appearance of ear canals and TMs, clear nasal congestion, mild post oropharyngeal erythema with PND, no tonsillar edema or exudate, no sinus TTP  NECK: no obvious masses on inspection  LUNGS: clear to auscultation bilaterally, no wheezes, rales or rhonchi, good air movement  CV: HRRR, no peripheral edema  MS: moves all extremities without noticeable abnormality  PSYCH: pleasant and cooperative, no obvious depression or anxiety  ASSESSMENT AND PLAN:  Discussed the following assessment and plan:  Acute upper respiratory infection  -given HPI and exam findings today, a serious infection or illness is unlikely. We discussed potential etiologies, with VURI being most likely, and advised supportive care and monitoring. He is not have any asthma symptoms, trouble breathing or a significant cough. We discussed treatment side effects, likely course, antibiotic misuse, transmission, and signs of developing a serious illness. -of course, we advised to return or notify a doctor immediately if symptoms worsen or persist or new concerns arise.    Patient Instructions  INSTRUCTIONS FOR UPPER RESPIRATORY INFECTION:  -plenty of rest and fluids  -nasal saline wash 2-3 times daily (use prepackaged nasal saline or bottled/distilled water if making your own)   -can use oxymetazoline nasal spray for drainage and nasal congestion - but do NOT use longer then 3-4 days  -can use tylenol or ibuprofen as directed  for aches and sorethroat  -in the winter time, using a humidifier at night is helpful (please follow cleaning instructions)  -if you are taking a cough medication - use only as directed, may also try a teaspoon of honey to coat the throat and throat lozenges  -for sore throat, salt water gargles can help  -follow up if you  have fevers, facial pain, tooth pain, difficulty breathing or are worsening or not getting better in 5-7 days      Wake Conlee, Dahlia ClientHANNAH R.

## 2014-01-17 NOTE — Progress Notes (Signed)
Pre visit review using our clinic review tool, if applicable. No additional management support is needed unless otherwise documented below in the visit note. 

## 2014-01-17 NOTE — Patient Instructions (Signed)
INSTRUCTIONS FOR UPPER RESPIRATORY INFECTION:  -plenty of rest and fluids  -nasal saline wash 2-3 times daily (use prepackaged nasal saline or bottled/distilled water if making your own)   -can use oxymetazoline nasal spray for drainage and nasal congestion - but do NOT use longer then 3-4 days  -can use tylenol or ibuprofen as directed for aches and sorethroat  -in the winter time, using a humidifier at night is helpful (please follow cleaning instructions)  -if you are taking a cough medication - use only as directed, may also try a teaspoon of honey to coat the throat and throat lozenges  -for sore throat, salt water gargles can help  -follow up if you have fevers, facial pain, tooth pain, difficulty breathing or are worsening or not getting better in 5-7 days  

## 2014-02-20 ENCOUNTER — Telehealth: Payer: Self-pay | Admitting: Internal Medicine

## 2014-02-20 NOTE — Telephone Encounter (Signed)
Noted  

## 2014-02-20 NOTE — Telephone Encounter (Signed)
[facility] - Client TELEPHONE ADVICE RECORD [facility] Patient Name: Carlos Burns DOB: [birth year] Initial Comment caller states he has a heaviness in his chest, cough, headache and breathing feels constricted Nurse Assessment Nurse: Chrys RacerKoenig, RN, Alexia FreestoneAnna Marie Date/Time Carlos Burns(Eastern Time): 02/20/2014 9:27:05 AM Confirm and document reason for call. If symptomatic, describe symptoms. ---caller states he has a heaviness in his chest, cough, headache and breathing feels constricted (" this has been going on for a month") "There is no pain in my chest, it is about my breathing". Has the patient traveled out of the country within the last 30 days? ---No Does the patient require triage? ---Yes Related visit to physician within the last 2 weeks? ---No Does the PT have any chronic conditions? (i.e. diabetes, asthma, etc.) ---Yes List chronic conditions. ---asthma Guidelines Guideline Title Affirmed Question Affirmed Notes Cough - Acute Productive SEVERE coughing spells (e.g., whooping sound after coughing, vomiting after coughing) Final Disposition User See Physician within 24 Hours Chrys RacerKoenig, RN, Alexia Freestonenna Marie Comments Appt on 02/21/2014 Thurs with Dr Burna FortsKwiatkwoski at 11 am, pt called back and aware

## 2014-02-21 ENCOUNTER — Ambulatory Visit (INDEPENDENT_AMBULATORY_CARE_PROVIDER_SITE_OTHER): Payer: 59 | Admitting: Internal Medicine

## 2014-02-21 ENCOUNTER — Encounter: Payer: Self-pay | Admitting: Internal Medicine

## 2014-02-21 VITALS — BP 120/80 | HR 57 | Temp 98.5°F | Resp 20 | Ht 70.5 in | Wt 245.0 lb

## 2014-02-21 DIAGNOSIS — J45901 Unspecified asthma with (acute) exacerbation: Secondary | ICD-10-CM

## 2014-02-21 DIAGNOSIS — I10 Essential (primary) hypertension: Secondary | ICD-10-CM

## 2014-02-21 DIAGNOSIS — J4520 Mild intermittent asthma, uncomplicated: Secondary | ICD-10-CM

## 2014-02-21 MED ORDER — ALBUTEROL SULFATE 108 (90 BASE) MCG/ACT IN AEPB: 2.0000 | INHALATION_SPRAY | Freq: Four times a day (QID) | RESPIRATORY_TRACT | Status: DC | PRN

## 2014-02-21 MED ORDER — PREDNISONE 20 MG PO TABS: 20.0000 mg | ORAL_TABLET | Freq: Two times a day (BID) | ORAL | Status: DC

## 2014-02-21 MED ORDER — HYDROCODONE-HOMATROPINE 5-1.5 MG/5ML PO SYRP: 5.0000 mL | ORAL_SOLUTION | Freq: Four times a day (QID) | ORAL | Status: DC | PRN

## 2014-02-21 NOTE — Progress Notes (Signed)
Pre visit review using our clinic review tool, if applicable. No additional management support is needed unless otherwise documented below in the visit note. 

## 2014-02-21 NOTE — Patient Instructions (Signed)
Acute bronchitis symptoms are generally not helped by antibiotics.  Take over-the-counter expectorants and cough medications such as  Mucinex DM.  Call if there is no improvement in 5 to 7 days or if  you develop worsening cough, fever, or new symptoms, such as shortness of breath or chest pain.   HOME CARE  Rest.  Drink enough fluids to keep your pee (urine) clear or pale yellow (unless you need to limit fluids as told by your doctor).  Only take over-the-counter or prescription medicines as told by your doctor.  Avoid smoking and secondhand smoke. These can make bronchitis worse. If you are a smoker, think about using nicotine gum or skin patches. Quitting smoking will help your lungs heal faster.  Reduce the chance of getting bronchitis again by:  Washing your hands often.  Avoiding people with cold symptoms.  Trying not to touch your hands to your mouth, nose, or eyes. Follow up with your doctor as told.

## 2014-02-21 NOTE — Progress Notes (Signed)
Subjective:    Patient ID: Carlos Burns, male    DOB: [birth year], 44 y.o.   MRN: [id number]  HPI   44 year old patient who has a history of stable chronic asthma.  Last month he developed a URI and was treated symptomatically.  He improved but now has developed a worsening cough over the past week with increasing wheezing and postnasal drip.  No fever or purulent productive cough.  Cough has been incessant and has interfered with sleep and daily activities.  He has been on maintenance Advair.   Past Medical History  Diagnosis Date  . ASTHMA 02/29/2008  . DERMATITIS 06/20/2009  . Headache(784.0) 06/25/2009    History   Social History  . Marital Status: Married    Spouse Name: N/A  . Number of Children: N/A  . Years of Education: N/A   Occupational History  . Not on file.   Social History Main Topics  . Smoking status: Never Smoker   . Smokeless tobacco: Never Used  . Alcohol Use: Yes  . Drug Use: No  . Sexual Activity: Not on file   Other Topics Concern  . Not on file   Social History Narrative    No past surgical history on file.  Family History  Problem Relation Age of Onset  . Hypertension Father   . Stroke Maternal Grandmother   . Hypertension Maternal Grandmother   . Stroke Maternal Grandfather   . Diabetes Maternal Grandfather   . Stroke Paternal Grandmother   . Diabetes Paternal Grandmother   . Hypertension Paternal Grandmother   . Diabetes Paternal Grandfather   . Hypertension Paternal Grandfather     No Known Allergies  Current Outpatient Prescriptions on File Prior to Visit  Medication Sig Dispense Refill  . cetirizine (ZYRTEC) 10 MG tablet Take 10 mg by mouth as needed.     . Fluticasone-Salmeterol (ADVAIR) 100-50 MCG/DOSE AEPB Inhale 1 puff into the lungs as needed.    Marland Kitchen lisinopril-hydrochlorothiazide (PRINZIDE,ZESTORETIC) 10-12.5 MG per tablet TAKE 1 TABLET BY MOUTH DAILY. 90 tablet 0  . albuterol (PROAIR HFA) 108 (90 BASE) MCG/ACT inhaler  Inhale 2 puffs into the lungs every 6 (six) hours as needed. For asthma (Patient not taking: Reported on 02/21/2014) 1 Inhaler 6   No current facility-administered medications on file prior to visit.    BP 120/80 mmHg  Pulse 57  Temp(Src) 98.5 F (36.9 C) (Oral)  Resp 20  Ht 5' 10.5" (1.791 m)  Wt 245 lb (111.131 kg)  BMI 34.65 kg/m2  SpO2 98%       Review of Systems  Constitutional: Positive for activity change, appetite change and fatigue. Negative for fever and chills.  HENT: Positive for postnasal drip. Negative for congestion, dental problem, ear pain, hearing loss, sore throat, tinnitus, trouble swallowing and voice change.   Eyes: Negative for pain, discharge and visual disturbance.  Respiratory: Positive for cough and wheezing. Negative for chest tightness and stridor.   Cardiovascular: Negative for chest pain, palpitations and leg swelling.  Gastrointestinal: Negative for nausea, vomiting, abdominal pain, diarrhea, constipation, blood in stool and abdominal distention.  Genitourinary: Negative for urgency, hematuria, flank pain, discharge, difficulty urinating and genital sores.  Musculoskeletal: Negative for myalgias, back pain, joint swelling, arthralgias, gait problem and neck stiffness.  Skin: Negative for rash.  Neurological: Negative for dizziness, syncope, speech difficulty, weakness, numbness and headaches.  Hematological: Negative for adenopathy. Does not bruise/bleed easily.  Psychiatric/Behavioral: Negative for behavioral problems and dysphoric mood. The patient is  not nervous/anxious.        Objective:   Physical Exam  Constitutional: He is oriented to person, place, and time. He appears well-developed.  Weight 245 Repeat blood pressure still 120 over 80  HENT:  Head: Normocephalic.  Right Ear: External ear normal.  Left Ear: External ear normal.  Eyes: Conjunctivae and EOM are normal.  Neck: Normal range of motion.  Cardiovascular: Normal rate,  regular rhythm and normal heart sounds.   Pulmonary/Chest: Effort normal and breath sounds normal. No respiratory distress. He has no wheezes. He has no rales.  No audible wheezing.  Presently  Abdominal: Bowel sounds are normal.  Musculoskeletal: Normal range of motion. He exhibits no edema or tenderness.  Neurological: He is alert and oriented to person, place, and time.  Psychiatric: He has a normal mood and affect. His behavior is normal.          Assessment & Plan:    asthmatic bronchitis.  Will treat cough more aggressively.  Will place on modest dose prednisone orally for the next 7 days.  Will continue maintenance Advair and refill albuterol Hypertension, well-controlled

## 2014-03-05 ENCOUNTER — Other Ambulatory Visit (INDEPENDENT_AMBULATORY_CARE_PROVIDER_SITE_OTHER): Payer: 59

## 2014-03-05 DIAGNOSIS — Z0000 Encounter for general adult medical examination without abnormal findings: Secondary | ICD-10-CM

## 2014-03-05 LAB — CBC WITH DIFFERENTIAL/PLATELET
Basophils Absolute: 0 10*3/uL (ref 0.0–0.1)
Basophils Relative: 0.3 % (ref 0.0–3.0)
EOS PCT: 0.8 % (ref 0.0–5.0)
Eosinophils Absolute: 0.1 10*3/uL (ref 0.0–0.7)
HCT: 44.5 % (ref 39.0–52.0)
Hemoglobin: 15.2 g/dL (ref 13.0–17.0)
LYMPHS PCT: 21 % (ref 12.0–46.0)
Lymphs Abs: 2.2 10*3/uL (ref 0.7–4.0)
MCHC: 34.2 g/dL (ref 30.0–36.0)
MCV: 90.4 fl (ref 78.0–100.0)
Monocytes Absolute: 0.8 10*3/uL (ref 0.1–1.0)
Monocytes Relative: 7.1 % (ref 3.0–12.0)
NEUTROS PCT: 70.8 % (ref 43.0–77.0)
Neutro Abs: 7.5 10*3/uL (ref 1.4–7.7)
Platelets: 306 10*3/uL (ref 150.0–400.0)
RBC: 4.92 Mil/uL (ref 4.22–5.81)
RDW: 14.5 % (ref 11.5–15.5)
WBC: 10.6 10*3/uL — AB (ref 4.0–10.5)

## 2014-03-05 LAB — COMPREHENSIVE METABOLIC PANEL
ALK PHOS: 78 U/L (ref 39–117)
ALT: 35 U/L (ref 0–53)
AST: 19 U/L (ref 0–37)
Albumin: 4 g/dL (ref 3.5–5.2)
BUN: 11 mg/dL (ref 6–23)
CHLORIDE: 103 meq/L (ref 96–112)
CO2: 29 mEq/L (ref 19–32)
Calcium: 9.3 mg/dL (ref 8.4–10.5)
Creatinine, Ser: 1.18 mg/dL (ref 0.40–1.50)
GFR: 86.25 mL/min (ref >60.00)
GLUCOSE: 86 mg/dL (ref 70–99)
POTASSIUM: 3.6 meq/L (ref 3.5–5.1)
Sodium: 137 mEq/L (ref 135–145)
TOTAL PROTEIN: 7.2 g/dL (ref 6.0–8.3)
Total Bilirubin: 0.7 mg/dL (ref 0.2–1.2)

## 2014-03-05 LAB — POCT URINALYSIS DIPSTICK
Bilirubin, UA: NEGATIVE
Glucose, UA: NEGATIVE
Ketones, UA: NEGATIVE
LEUKOCYTES UA: NEGATIVE
NITRITE UA: NEGATIVE
PH UA: 5.5
PROTEIN UA: NEGATIVE
Spec Grav, UA: 1.02
UROBILINOGEN UA: 0.2

## 2014-03-05 LAB — LIPID PANEL
CHOLESTEROL: 155 mg/dL (ref 0–200)
HDL: 37.5 mg/dL — ABNORMAL LOW (ref >39.00)
LDL CALC: 99 mg/dL (ref 0–99)
NonHDL: 117.5
TRIGLYCERIDES: 95 mg/dL (ref 0.0–149.0)
Total CHOL/HDL Ratio: 4
VLDL: 19 mg/dL (ref 0.0–40.0)

## 2014-03-05 LAB — TSH: TSH: 0.8 u[IU]/mL (ref 0.35–4.50)

## 2014-03-12 ENCOUNTER — Ambulatory Visit (INDEPENDENT_AMBULATORY_CARE_PROVIDER_SITE_OTHER): Payer: 59 | Admitting: Internal Medicine

## 2014-03-12 ENCOUNTER — Encounter: Payer: Self-pay | Admitting: Internal Medicine

## 2014-03-12 VITALS — HR 79 | Temp 98.3°F | Resp 20 | Ht 70.5 in | Wt 245.0 lb

## 2014-03-12 DIAGNOSIS — J4520 Mild intermittent asthma, uncomplicated: Secondary | ICD-10-CM

## 2014-03-12 DIAGNOSIS — Z0000 Encounter for general adult medical examination without abnormal findings: Secondary | ICD-10-CM

## 2014-03-12 DIAGNOSIS — I10 Essential (primary) hypertension: Secondary | ICD-10-CM

## 2014-03-12 MED ORDER — LISINOPRIL-HYDROCHLOROTHIAZIDE 10-12.5 MG PO TABS: 1.0000 | ORAL_TABLET | Freq: Every day | ORAL | Status: DC

## 2014-03-12 MED ORDER — FLUTICASONE-SALMETEROL 100-50 MCG/DOSE IN AEPB: 1.0000 | INHALATION_SPRAY | RESPIRATORY_TRACT | Status: DC | PRN

## 2014-03-12 NOTE — Patient Instructions (Addendum)
Limit your sodium (Salt) intake  Please check your blood pressure on a regular basis.  If it is consistently greater than 150/90, please make an office appointment.    It is important that you exercise regularly, at least 20 minutes 3 to 4 times per week.  If you develop chest pain or shortness of breath seek  medical attention.  You need to lose weight.  Consider a lower calorie diet and regular exercise.  Return in one year for follow-up Health Maintenance A healthy lifestyle and preventative care can promote health and wellness.  Maintain regular health, dental, and eye exams.  Eat a healthy diet. Foods like vegetables, fruits, whole grains, low-fat dairy products, and lean protein foods contain the nutrients you need and are low in calories. Decrease your intake of foods high in solid fats, added sugars, and salt. Get information about a proper diet from your health care provider, if necessary.  Regular physical exercise is one of the most important things you can do for your health. Most adults should get at least 150 minutes of moderate-intensity exercise (any activity that increases your heart rate and causes you to sweat) each week. In addition, most adults need muscle-strengthening exercises on 2 or more days a week.   Maintain a healthy weight. The body mass index (BMI) is a screening tool to identify possible weight problems. It provides an estimate of body fat based on height and weight. Your health care provider can find your BMI and can help you achieve or maintain a healthy weight. For males 20 years and older:  A BMI below 18.5 is considered underweight.  A BMI of 18.5 to 24.9 is normal.  A BMI of 25 to 29.9 is considered overweight.  A BMI of 30 and above is considered obese.  Maintain normal blood lipids and cholesterol by exercising and minimizing your intake of saturated fat. Eat a balanced diet with plenty of fruits and vegetables. Blood tests for lipids and  cholesterol should begin at age 44 and be repeated every 5 years. If your lipid or cholesterol levels are high, you are over age 44, or you are at high risk for heart disease, you may need your cholesterol levels checked more frequently.Ongoing high lipid and cholesterol levels should be treated with medicines if diet and exercise are not working.  If you smoke, find out from your health care provider how to quit. If you do not use tobacco, do not start.  Lung cancer screening is recommended for adults aged 55-80 years who are at high risk for developing lung cancer because of a history of smoking. A yearly low-dose CT scan of the lungs is recommended for people who have at least a 30-pack-year history of smoking and are current smokers or have quit within the past 15 years. A pack year of smoking is smoking an average of 1 pack of cigarettes a day for 1 year (for example, a 30-pack-year history of smoking could mean smoking 1 pack a day for 30 years or 2 packs a day for 15 years). Yearly screening should continue until the smoker has stopped smoking for at least 15 years. Yearly screening should be stopped for people who develop a health problem that would prevent them from having lung cancer treatment.  If you choose to drink alcohol, do not have more than 2 drinks per day. One drink is considered to be 12 oz (360 mL) of beer, 5 oz (150 mL) of wine, or 1.5 oz (45  mL) of liquor.  Avoid the use of street drugs. Do not share needles with anyone. Ask for help if you need support or instructions about stopping the use of drugs.  High blood pressure causes heart disease and increases the risk of stroke. Blood pressure should be checked at least every 1-2 years. Ongoing high blood pressure should be treated with medicines if weight loss and exercise are not effective.  If you are 81-61 years old, ask your health care provider if you should take aspirin to prevent heart disease.  Diabetes screening involves  taking a blood sample to check your fasting blood sugar level. This should be done once every 3 years after age 74 if you are at a normal weight and without risk factors for diabetes. Testing should be considered at a younger age or be carried out more frequently if you are overweight and have at least 1 risk factor for diabetes.  Colorectal cancer can be detected and often prevented. Most routine colorectal cancer screening begins at the age of 12 and continues through age 69. However, your health care provider may recommend screening at an earlier age if you have risk factors for colon cancer. On a yearly basis, your health care provider may provide home test kits to check for hidden blood in the stool. A small camera at the end of a tube may be used to directly examine the colon (sigmoidoscopy or colonoscopy) to detect the earliest forms of colorectal cancer. Talk to your health care provider about this at age 28 when routine screening begins. A direct exam of the colon should be repeated every 5-10 years through age 19, unless early forms of precancerous polyps or small growths are found.  People who are at an increased risk for hepatitis B should be screened for this virus. You are considered at high risk for hepatitis B if:  You were born in a country where hepatitis B occurs often. Talk with your health care provider about which countries are considered high risk.  Your parents were born in a high-risk country and you have not received a shot to protect against hepatitis B (hepatitis B vaccine).  You have HIV or AIDS.  You use needles to inject street drugs.  You live with, or have sex with, someone who has hepatitis B.  You are a man who has sex with other men (MSM).  You get hemodialysis treatment.  You take certain medicines for conditions like cancer, organ transplantation, and autoimmune conditions.  Hepatitis C blood testing is recommended for all people born from [birth year] through 1965  and any individual with known risk factors for hepatitis C.  Healthy men should no longer receive prostate-specific antigen (PSA) blood tests as part of routine cancer screening. Talk to your health care provider about prostate cancer screening.  Testicular cancer screening is not recommended for adolescents or adult males who have no symptoms. Screening includes self-exam, a health care provider exam, and other screening tests. Consult with your health care provider about any symptoms you have or any concerns you have about testicular cancer.  Practice safe sex. Use condoms and avoid high-risk sexual practices to reduce the spread of sexually transmitted infections (STIs).  You should be screened for STIs, including gonorrhea and chlamydia if:  You are sexually active and are younger than 24 years.  You are older than 24 years, and your health care provider tells you that you are at risk for this type of infection.  Your sexual activity  has changed since you were last screened, and you are at an increased risk for chlamydia or gonorrhea. Ask your health care provider if you are at risk.  If you are at risk of being infected with HIV, it is recommended that you take a prescription medicine daily to prevent HIV infection. This is called pre-exposure prophylaxis (PrEP). You are considered at risk if:  You are a man who has sex with other men (MSM).  You are a heterosexual man who is sexually active with multiple partners.  You take drugs by injection.  You are sexually active with a partner who has HIV.  Talk with your health care provider about whether you are at high risk of being infected with HIV. If you choose to begin PrEP, you should first be tested for HIV. You should then be tested every 3 months for as long as you are taking PrEP.  Use sunscreen. Apply sunscreen liberally and repeatedly throughout the day. You should seek shade when your shadow is shorter than you. Protect  yourself by wearing long sleeves, pants, a wide-brimmed hat, and sunglasses year round whenever you are outdoors.  Tell your health care provider of new moles or changes in moles, especially if there is a change in shape or color. Also, tell your health care provider if a mole is larger than the size of a pencil eraser.  A one-time screening for abdominal aortic aneurysm (AAA) and surgical repair of large AAAs by ultrasound is recommended for men aged 26-75 years who are current or former smokers.  Stay current with your vaccines (immunizations). Document Released: 06/26/2007 Document Revised: 01/02/2013 Document Reviewed: 05/25/2010 [facility] Patient Information 2015 [place], [place]. This information is not intended to replace advice given to you by your health care provider. Make sure you discuss any questions you have with your health care provider.

## 2014-03-12 NOTE — Progress Notes (Signed)
Pre visit review using our clinic review tool, if applicable. No additional management support is needed unless otherwise documented below in the visit note. 

## 2014-03-12 NOTE — Progress Notes (Signed)
Subjective:    Patient ID: Carlos Burns, male    DOB: Oct 15, 1970, 44 y.o.   MRN: [id number]  HPI [age] year old patient who is seen today for a wellness exam.  Doing quite well without concerns or complaints  Current Allergies: No known allergies   Past Medical History:  Reviewed history and no changes required:  Asthma  testicular nodule, h/o Past Surgical History:  wisdom teeth, extraction    Family History:  Reviewed history and no changes required:  father age 44, history of hypertension  mother, age 44 , history of hypertension and diabetes    Three of 4 grandparents deceased; positive for cerebrovascular disease, diabetes, and hypertension    One sister two brothers in good health-sister. 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  HENT: Negative for congestion, dental problem, ear pain, hearing loss, mouth sores, rhinorrhea, sinus pressure, sneezing, tinnitus, trouble swallowing and voice change.   Eyes: Negative for photophobia, pain, redness and visual disturbance.  Respiratory: Negative for apnea, cough, choking, chest tightness, shortness of breath and wheezing.   Cardiovascular: Negative for chest pain, palpitations and leg swelling.  Gastrointestinal: Negative for nausea, vomiting, abdominal pain, diarrhea, constipation, blood in stool, abdominal distention, anal bleeding and rectal pain.  Genitourinary: Negative for dysuria, urgency, frequency, hematuria, flank pain, decreased urine volume, discharge, penile swelling, scrotal swelling, difficulty urinating, genital sores and testicular pain.    Musculoskeletal: Negative for myalgias, back pain, joint swelling, arthralgias, gait problem, neck pain and neck stiffness.  Skin: Negative for color change, rash and wound.  Neurological: Negative for dizziness, tremors, seizures, syncope, facial asymmetry, speech difficulty, weakness, light-headedness, numbness and headaches.  Hematological: Negative for adenopathy. Does not bruise/bleed easily.  Psychiatric/Behavioral: Negative for suicidal ideas, hallucinations, behavioral problems, confusion, sleep disturbance, self-injury, dysphoric mood, decreased concentration and agitation. The patient is not nervous/anxious.        Objective:   Physical Exam  Constitutional: He appears well-developed and well-nourished.  Weight 245 Blood pressure 120/80  HENT:  Head: Normocephalic and atraumatic.  Right Ear: External ear normal.  Left Ear: External ear normal.  Nose: Nose normal.  Mouth/Throat: Oropharynx is clear and moist.  Eyes: Conjunctivae and EOM are normal. Pupils are equal, round, and reactive to light. No scleral icterus.  Neck: Normal range of motion. Neck supple. No JVD present. No thyromegaly present.  Cardiovascular: Regular rhythm, normal heart sounds and intact distal pulses.  Exam reveals no gallop and no friction rub.   No murmur heard. Pulmonary/Chest: Effort normal and breath sounds normal. He exhibits no tenderness.  Abdominal: Soft. Bowel sounds are normal. He exhibits no distension and no mass. There is no tenderness.  Genitourinary: Penis normal.  Musculoskeletal: Normal range of motion. He exhibits no edema or tenderness.  Lymphadenopathy:    He has no cervical adenopathy.  Neurological: He is alert. He has normal reflexes. No cranial nerve deficit. Coordination normal.  Skin: Skin is warm and dry. No rash noted.  Psychiatric: He has a normal mood and affect. His behavior is normal.          Assessment & Plan:   Preventive health examination Hypertension,  well-controlled Asthma, stable Obesity.  Weight loss encouraged  Low-salt diet.  Home blood  pressure monitoring recommended Recheck one year or as needed

## 2014-03-13 ENCOUNTER — Telehealth: Payer: Self-pay | Admitting: Internal Medicine

## 2014-03-13 NOTE — Telephone Encounter (Signed)
emmi emailed °

## 2015-02-17 MED FILL — LISINOPRIL-HCTZ 10-12.5 MG: 10-12.5 | 90 days supply | Qty: 90 | Fill #1

## 2015-04-07 ENCOUNTER — Ambulatory Visit (INDEPENDENT_AMBULATORY_CARE_PROVIDER_SITE_OTHER): Payer: 59 | Admitting: Family Medicine

## 2015-04-07 ENCOUNTER — Encounter: Payer: Self-pay | Admitting: Family Medicine

## 2015-04-07 ENCOUNTER — Ambulatory Visit: Payer: 59 | Admitting: Internal Medicine

## 2015-04-07 VITALS — BP 118/60 | HR 80 | Temp 98.3°F | Wt 253.9 lb

## 2015-04-07 DIAGNOSIS — G43009 Migraine without aura, not intractable, without status migrainosus: Secondary | ICD-10-CM

## 2015-04-07 MED ORDER — SUMATRIPTAN SUCCINATE 100 MG PO TABS: ORAL_TABLET | ORAL | Status: DC

## 2015-04-07 MED FILL — SUMATRIPTAN SUCC 100 MG TAB: 100 | 30 days supply | Qty: 9 | Fill #0

## 2015-04-07 NOTE — Patient Instructions (Signed)
If headache persists, or you develop new symptoms please follow up for further evaluation.   Migraine Headache A migraine headache is an intense, throbbing pain on one or both sides of your head. A migraine can last for 30 minutes to several hours. CAUSES  The exact cause of a migraine headache is not always known. However, a migraine may be caused when nerves in the brain become irritated and release chemicals that cause inflammation. This causes pain. Certain things may also trigger migraines, such as:  Alcohol.  Smoking.  Stress.  Menstruation.  Aged cheeses.  Foods or drinks that contain nitrates, glutamate, aspartame, or tyramine.  Lack of sleep.  Chocolate.  Caffeine.  Hunger.  Physical exertion.  Fatigue.  Medicines used to treat chest pain (nitroglycerine), birth control pills, estrogen, and some blood pressure medicines. SIGNS AND SYMPTOMS  Pain on one or both sides of your head.  Pulsating or throbbing pain.  Severe pain that prevents daily activities.  Pain that is aggravated by any physical activity.  Nausea, vomiting, or both.  Dizziness.  Pain with exposure to bright lights, loud noises, or activity.  General sensitivity to bright lights, loud noises, or smells. Before you get a migraine, you may get warning signs that a migraine is coming (aura). An aura may include:  Seeing flashing lights.  Seeing bright spots, halos, or zigzag lines.  Having tunnel vision or blurred vision.  Having feelings of numbness or tingling.  Having trouble talking.  Having muscle weakness. DIAGNOSIS  A migraine headache is often diagnosed based on:  Symptoms.  Physical exam.  A CT scan or MRI of your head. These imaging tests cannot diagnose migraines, but they can help rule out other causes of headaches. TREATMENT Medicines may be given for pain and nausea. Medicines can also be given to help prevent recurrent migraines.  HOME CARE  INSTRUCTIONS  Only take over-the-counter or prescription medicines for pain or discomfort as directed by your health care provider. The use of long-term narcotics is not recommended.  Lie down in a dark, quiet room when you have a migraine.  Keep a journal to find out what may trigger your migraine headaches. For example, write down:  What you eat and drink.  How much sleep you get.  Any change to your diet or medicines.  Limit alcohol consumption.  Quit smoking if you smoke.  Get 7-9 hours of sleep, or as recommended by your health care provider.  Limit stress.  Keep lights dim if bright lights bother you and make your migraines worse. SEEK IMMEDIATE MEDICAL CARE IF:   Your migraine becomes severe.  You have a fever.  You have a stiff neck.  You have vision loss.  You have muscular weakness or loss of muscle control.  You start losing your balance or have trouble walking.  You feel faint or pass out.  You have severe symptoms that are different from your first symptoms. MAKE SURE YOU:   Understand these instructions.  Will watch your condition.  Will get help right away if you are not doing well or get worse.   This information is not intended to replace advice given to you by your health care provider. Make sure you discuss any questions you have with your health care provider.   Document Released: 12/28/2004 Document Revised: 01/18/2014 Document Reviewed: 09/04/2012 Elsevier Interactive Patient Education [employer]2016 [employer] Inc.

## 2015-04-07 NOTE — Progress Notes (Signed)
Subjective:    Patient ID: Carlos Burns, male    DOB: May 06, 1970, 45 y.o.   MRN: [id number]  HPI  Mr. Carlos Burns is a 45 year old male who presents today with a migraine that has improved. Symptoms started this morning noting the pain as a 9, unilateral pain on right side, aggravated with activity, improved with rest and  was relieved with ibuprofen of 400mg . He has a history of migraines that are infrequent however he was first evaluated at age 45. He notes that he also had a headache 5 days ago but it improved with rest and ibuprofen 200 mg. He denies N/V, visual disturbances, congestion, sinus pressure/pain, rhinitis, stiff neck, or fever.  He reports checking his blood pressure during the headache and it was noted to be 120/60    Review of Systems  Constitutional: Negative for fever, chills and fatigue.  HENT: Negative for congestion, ear pain, hearing loss, nosebleeds, postnasal drip, rhinorrhea, sinus pressure, sneezing, sore throat and tinnitus.   Eyes: Negative for photophobia, pain, discharge and visual disturbance.  Respiratory: Negative for cough, chest tightness, shortness of breath and wheezing.   Cardiovascular: Negative for chest pain, palpitations and leg swelling.  Gastrointestinal: Negative for nausea, vomiting, abdominal pain and diarrhea.  Musculoskeletal: Negative for myalgias, back pain, joint swelling, arthralgias, neck pain and neck stiffness.  Skin: Negative for rash.  Allergic/Immunologic: Positive for environmental allergies.  Neurological: Positive for headaches. Negative for dizziness, seizures, syncope, speech difficulty, weakness, light-headedness and numbness.  Psychiatric/Behavioral: Negative for sleep disturbance. The patient is not nervous/anxious.      Past Medical History  Diagnosis Date  . ASTHMA 02/29/2008  . DERMATITIS 06/20/2009  . Headache(784.0) 06/25/2009    Social History   Social History  . Marital Status: Married    Spouse Name: N/A   . Number of Children: N/A  . Years of Education: N/A   Occupational History  . Not on file.   Social History Main Topics  . Smoking status: Never Smoker   . Smokeless tobacco: Never Used  . Alcohol Use: Yes  . Drug Use: No  . Sexual Activity: Not on file   Other Topics Concern  . Not on file   Social History Narrative    No past surgical history on file.  Family History  Problem Relation Age of Onset  . Hypertension Father   . Stroke Maternal Grandmother   . Hypertension Maternal Grandmother   . Stroke Maternal Grandfather   . Diabetes Maternal Grandfather   . Stroke Paternal Grandmother   . Diabetes Paternal Grandmother   . Hypertension Paternal Grandmother   . Diabetes Paternal Grandfather   . Hypertension Paternal Grandfather     No Known Allergies  Current Outpatient Prescriptions on File Prior to Visit  Medication Sig Dispense Refill  . Albuterol Sulfate (PROAIR RESPICLICK) 108 (90 BASE) MCG/ACT AEPB Inhale 2 sprays into the lungs every 6 (six) hours as needed. 1 each 6  . cetirizine (ZYRTEC) 10 MG tablet Take 10 mg by mouth as needed.     . Fluticasone-Salmeterol (ADVAIR) 100-50 MCG/DOSE AEPB Inhale 1 puff into the lungs as needed. 60 each 5  . lisinopril-hydrochlorothiazide (PRINZIDE,ZESTORETIC) 10-12.5 MG per tablet Take 1 tablet by mouth daily. 90 tablet 3   No current facility-administered medications on file prior to visit.    BP 118/60 mmHg  Pulse 80  Temp(Src) 98.3 F (36.8 C) (Oral)  Wt 253 lb 14.4 oz (115.168 kg)  SpO2 97%  Objective:   Physical Exam  Constitutional: He is oriented to person, place, and time. He appears well-developed and well-nourished.  HENT:  Right Ear: Tympanic membrane normal.  Left Ear: Tympanic membrane normal.  Nose: No rhinorrhea. Right sinus exhibits no maxillary sinus tenderness and no frontal sinus tenderness. Left sinus exhibits no maxillary sinus tenderness and no frontal sinus tenderness.    Mouth/Throat: Mucous membranes are normal. No oropharyngeal exudate or posterior oropharyngeal erythema.  Eyes: Pupils are equal, round, and reactive to light.  Neck: Neck supple.  Cardiovascular: Normal rate, regular rhythm and normal heart sounds.   No murmur heard. Pulmonary/Chest: Effort normal and breath sounds normal. He has no wheezes. He has no rales.  Abdominal: Soft. Bowel sounds are normal. There is no tenderness.  Lymphadenopathy:    He has no cervical adenopathy.  Neurological: He is alert and oriented to person, place, and time. He has normal strength. He displays normal reflexes. No cranial nerve deficit or sensory deficit. He exhibits normal muscle tone. Coordination and gait normal.  Skin: Skin is warm and dry. No rash noted.  Psychiatric: He has a normal mood and affect. His behavior is normal.       Assessment & Plan:  1. Migraine without aura and without status migrainosus, not intractable Discussed with patient abortive therapy for migraines. Also advised patient that chronic use of ibuprofen can cause rebound headaches. Patient is neurologically intact and has a history of migraines that have occurred infrequently, however he was first evaluated at age 41. Advised patient to RTC for further evaluation with PCP if he has persistent or worsening headache despite therapy, headache that is exacerbated with cough, or recurrent headache with new symptoms. Discussed the possibility of imaging that may be necessary in these circumstances. Further advised patient to go to the ER if any red flag symptoms are present. Patient voiced understanding and agreed with plan.  - SUMAtriptan (IMITREX) 100 MG tablet; May repeat in 2 hours if headache persists or recurs. Maximum 2 doses in 24 hours  Dispense: 10 tablet; Refill: 0

## 2015-04-07 NOTE — Progress Notes (Signed)
Pre visit review using our clinic review tool, if applicable. No additional management support is needed unless otherwise documented below in the visit note. 

## 2015-05-24 DIAGNOSIS — H5213 Myopia, bilateral: Secondary | ICD-10-CM | POA: Diagnosis not present

## 2015-05-26 ENCOUNTER — Other Ambulatory Visit: Payer: Self-pay | Admitting: Internal Medicine

## 2015-05-26 MED FILL — LISINOPRIL-HCTZ 10-12.5 MG: 10-12.5 | 90 days supply | Qty: 90 | Fill #0

## 2015-06-17 ENCOUNTER — Other Ambulatory Visit (INDEPENDENT_AMBULATORY_CARE_PROVIDER_SITE_OTHER): Payer: 59

## 2015-06-17 DIAGNOSIS — Z0000 Encounter for general adult medical examination without abnormal findings: Secondary | ICD-10-CM

## 2015-06-17 LAB — POC URINALSYSI DIPSTICK (AUTOMATED)
Bilirubin, UA: NEGATIVE
GLUCOSE UA: NEGATIVE
KETONES UA: NEGATIVE
Leukocytes, UA: NEGATIVE
NITRITE UA: NEGATIVE
Protein, UA: NEGATIVE
SPEC GRAV UA: 1.02
UROBILINOGEN UA: 0.2
pH, UA: 6

## 2015-06-17 LAB — BASIC METABOLIC PANEL
BUN: 12 mg/dL (ref 6–23)
CALCIUM: 9.1 mg/dL (ref 8.4–10.5)
CO2: 30 meq/L (ref 19–32)
CREATININE: 1.28 mg/dL (ref 0.40–1.50)
Chloride: 104 mEq/L (ref 96–112)
GFR: 78.06 mL/min (ref >60.00)
GLUCOSE: 84 mg/dL (ref 70–99)
Potassium: 3.9 mEq/L (ref 3.5–5.1)
Sodium: 139 mEq/L (ref 135–145)

## 2015-06-17 LAB — HEPATIC FUNCTION PANEL
ALK PHOS: 75 U/L (ref 39–117)
ALT: 21 U/L (ref 0–53)
AST: 17 U/L (ref 0–37)
Albumin: 4 g/dL (ref 3.5–5.2)
BILIRUBIN DIRECT: 0.1 mg/dL (ref 0.0–0.3)
BILIRUBIN TOTAL: 0.5 mg/dL (ref 0.2–1.2)
TOTAL PROTEIN: 6.9 g/dL (ref 6.0–8.3)

## 2015-06-17 LAB — CBC WITH DIFFERENTIAL/PLATELET
BASOS ABS: 0 10*3/uL (ref 0.0–0.1)
Basophils Relative: 0.2 % (ref 0.0–3.0)
EOS ABS: 0.2 10*3/uL (ref 0.0–0.7)
Eosinophils Relative: 2.7 % (ref 0.0–5.0)
HCT: 43.8 % (ref 39.0–52.0)
Hemoglobin: 14.7 g/dL (ref 13.0–17.0)
LYMPHS ABS: 1.8 10*3/uL (ref 0.7–4.0)
Lymphocytes Relative: 21.9 % (ref 12.0–46.0)
MCHC: 33.6 g/dL (ref 30.0–36.0)
MCV: 90.5 fl (ref 78.0–100.0)
Monocytes Absolute: 0.5 10*3/uL (ref 0.1–1.0)
Monocytes Relative: 5.7 % (ref 3.0–12.0)
NEUTROS ABS: 5.6 10*3/uL (ref 1.4–7.7)
NEUTROS PCT: 69.5 % (ref 43.0–77.0)
PLATELETS: 249 10*3/uL (ref 150.0–400.0)
RBC: 4.83 Mil/uL (ref 4.22–5.81)
RDW: 14.8 % (ref 11.5–15.5)
WBC: 8 10*3/uL (ref 4.0–10.5)

## 2015-06-17 LAB — LIPID PANEL
Cholesterol: 181 mg/dL (ref 0–200)
HDL: 39.5 mg/dL (ref >39.00)
LDL CALC: 126 mg/dL — AB (ref 0–99)
NonHDL: 141.67
Total CHOL/HDL Ratio: 5
Triglycerides: 78 mg/dL (ref 0.0–149.0)
VLDL: 15.6 mg/dL (ref 0.0–40.0)

## 2015-06-17 LAB — TSH: TSH: 1.79 u[IU]/mL (ref 0.35–4.50)

## 2015-06-24 ENCOUNTER — Ambulatory Visit (INDEPENDENT_AMBULATORY_CARE_PROVIDER_SITE_OTHER): Payer: 59 | Admitting: Internal Medicine

## 2015-06-24 ENCOUNTER — Encounter: Payer: Self-pay | Admitting: Internal Medicine

## 2015-06-24 VITALS — BP 128/82 | HR 78 | Temp 98.3°F | Ht 70.5 in | Wt 252.0 lb

## 2015-06-24 DIAGNOSIS — Z0000 Encounter for general adult medical examination without abnormal findings: Secondary | ICD-10-CM

## 2015-06-24 DIAGNOSIS — J4520 Mild intermittent asthma, uncomplicated: Secondary | ICD-10-CM | POA: Diagnosis not present

## 2015-06-24 DIAGNOSIS — I10 Essential (primary) hypertension: Secondary | ICD-10-CM | POA: Diagnosis not present

## 2015-06-24 MED ORDER — FLUTICASONE-SALMETEROL 100-50 MCG/DOSE IN AEPB: 1.0000 | INHALATION_SPRAY | RESPIRATORY_TRACT | Status: DC | PRN

## 2015-06-24 NOTE — Progress Notes (Signed)
Subjective:    Patient ID: Carlos Burns, male    DOB: [birth year], 45 y.o.   MRN: [id number]  HPI 45 year old patient who is in today for a health maintenance exam.  Medical problems include hypertension and chronic stable asthma. He has done well on maintenance medication. He has resumed antihypertensive medication and blood pressure has been better controlled.  Current Allergies:  No known allergies   Past Medical History:   Asthma  testicular nodule   Past Surgical History:  wisdom teeth, extraction   Family History:  Reviewed history and no changes required:  father age 75 history of hypertension  mother, age 61  in good health DM2 Three of 4 grandparents deceased; positive for cerebrovascular disease, diabetes, and hypertension  One sister two brothers in good health-sister. History of systolic murmur   Social History:   Remarried. 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  HENT: Negative for congestion, dental problem, ear pain, hearing loss, mouth sores, rhinorrhea, sinus pressure, sneezing, tinnitus, trouble swallowing and voice change.   Eyes: Negative for photophobia, pain, redness and visual disturbance.  Respiratory: Negative for apnea, cough, choking, chest tightness, shortness of breath and wheezing.   Cardiovascular: Negative for chest pain, palpitations and leg swelling.  Gastrointestinal: Negative for nausea, vomiting, abdominal pain, diarrhea, constipation, blood in stool, abdominal distention, anal bleeding and rectal pain.  Genitourinary: Negative for dysuria, urgency, frequency, hematuria, flank pain, decreased urine volume, discharge, penile swelling, scrotal swelling, difficulty urinating, genital sores and testicular pain.  Musculoskeletal:  Negative for myalgias, back pain, joint swelling, arthralgias, gait problem, neck pain and neck stiffness.  Skin: Negative for color change, rash and wound.  Neurological: Negative for dizziness, tremors, seizures, syncope, facial asymmetry, speech difficulty, weakness, light-headedness, numbness and headaches.  Hematological: Negative for adenopathy. Does not bruise/bleed easily.  Psychiatric/Behavioral: Negative for suicidal ideas, hallucinations, behavioral problems, confusion, sleep disturbance, self-injury, dysphoric mood, decreased concentration and agitation. The patient is not nervous/anxious.        Objective:   Physical Exam  Constitutional: He appears well-developed and well-nourished.  HENT:  Head: Normocephalic and atraumatic.  Right Ear: External ear normal.  Left Ear: External ear normal.  Nose: Nose normal.  Mouth/Throat: Oropharynx is clear and moist.  Eyes: Conjunctivae and EOM are normal. Pupils are equal, round, and reactive to light. No scleral icterus.  Neck: Normal range of motion. Neck supple. No JVD present. No thyromegaly present.  Cardiovascular: Regular rhythm, normal heart sounds and intact distal pulses.  Exam reveals no gallop and no friction rub.   No murmur heard. Pulmonary/Chest: Effort normal and breath sounds normal. He exhibits no tenderness.  Abdominal: Soft. Bowel sounds are normal. He exhibits no distension and no mass. There is no tenderness.  Genitourinary: Penis normal.  Musculoskeletal: Normal range of motion. He exhibits no edema or tenderness.  Lymphadenopathy:    He has no cervical adenopathy.  Neurological: He is alert. He has normal reflexes. No cranial nerve deficit. Coordination normal.  Skin: Skin is warm and dry. No rash noted.  Psychiatric: He has a normal mood and affect. His behavior is normal.          Assessment & Plan:    Preventive health exam Asthma stable Hypertension stable Mild exogenous obesity. Exercise  regimen weight loss encouraged  We'll continue present regimen Weight loss encouraged Low-salt diet recommended Return in 6-12 months for follow-up  More vigorous exercise regimen.  Also encouraged   Rogelia BogaKWIATKOWSKI,PETER FRANK, MD

## 2015-06-24 NOTE — Patient Instructions (Signed)

## 2015-06-24 NOTE — Progress Notes (Signed)
Pre visit review using our clinic review tool, if applicable. No additional management support is needed unless otherwise documented below in the visit note. 

## 2015-09-19 MED FILL — LISINOPRIL-HCTZ 10-12.5 MG: 10-12.5 | 90 days supply | Qty: 90 | Fill #1

## 2015-12-16 ENCOUNTER — Ambulatory Visit (INDEPENDENT_AMBULATORY_CARE_PROVIDER_SITE_OTHER): Payer: 59 | Admitting: Internal Medicine

## 2015-12-16 ENCOUNTER — Encounter: Payer: Self-pay | Admitting: Internal Medicine

## 2015-12-16 DIAGNOSIS — J4520 Mild intermittent asthma, uncomplicated: Secondary | ICD-10-CM

## 2015-12-16 MED ORDER — HYDROCODONE-HOMATROPINE 5-1.5 MG/5ML PO SYRP: 5.0000 mL | ORAL_SOLUTION | Freq: Three times a day (TID) | ORAL | 0 refills | Status: DC | PRN

## 2015-12-16 NOTE — Patient Instructions (Signed)
I would recommend to start taking the zyrtec again and keep taking the flonase.   We have given you a cough syrup which you can use at night time for the cough.   This is likely coming from the allergies and the lungs sound clear today.

## 2015-12-16 NOTE — Assessment & Plan Note (Signed)
No exacerbation today. Not taking advair and not using albuterol. Rx for hycodan for sleeping. Advised to resume zyrtec and continue flonase.

## 2015-12-16 NOTE — Progress Notes (Signed)
   Subjective:    Patient ID: Carlos Burns, male    DOB: 1970-09-30, 45 y.o.   MRN: [id number]  HPI The patient is a 45 YO man with asthma coming in for cough at night time. He denies SOB. He only takes the advair when he is having problems and not taking right now. Not using albuterol inhaler or limitation of activity. He is having some drainage in his throat. No nasal congestion or sinus pain or pressure. No fevers or chills. Had a cold at the onset about 3 weeks ago and has just not gone away. He is taking his flonase but not zyrtec.   Review of Systems  Constitutional: Negative.   HENT: Positive for postnasal drip. Negative for congestion, ear discharge, ear pain, rhinorrhea, sinus pain, sinus pressure, sore throat, tinnitus and trouble swallowing.   Eyes: Negative.   Respiratory: Positive for cough. Negative for chest tightness, shortness of breath and wheezing.   Cardiovascular: Negative.   Gastrointestinal: Negative.   Musculoskeletal: Negative.       Objective:   Physical Exam  Constitutional: He is oriented to person, place, and time. He appears well-developed and well-nourished.  HENT:  Head: Normocephalic and atraumatic.  Oropharynx with redness and mild clear drainage, no nasal crusting and no sinus pressure.   Eyes: EOM are normal.  Neck: Normal range of motion.  Cardiovascular: Normal rate and regular rhythm.   Pulmonary/Chest: Effort normal and breath sounds normal. No respiratory distress. He has no wheezes. He has no rales.  Abdominal: Soft.  Neurological: He is alert and oriented to person, place, and time.  Skin: Skin is warm and dry.   Vitals:   12/16/15 1116  BP: 120/82  Pulse: 66  Resp: 16  Temp: 98.4 F (36.9 C)  TempSrc: Oral  SpO2: 98%  Weight: 260 lb (117.9 kg)  Height: 5\' 11"  (1.803 m)      Assessment & Plan:

## 2015-12-16 NOTE — Progress Notes (Signed)
Pre visit review using our clinic review tool, if applicable. No additional management support is needed unless otherwise documented below in the visit note. 

## 2016-01-07 MED FILL — LISINOPRIL-HCTZ 10-12.5 MG: 10-12.5 | 90 days supply | Qty: 90 | Fill #2

## 2016-04-21 MED FILL — LISINOPRIL-HCTZ 10-12.5 MG: 10-12.5 | 90 days supply | Qty: 90 | Fill #3

## 2016-04-27 DIAGNOSIS — H10413 Chronic giant papillary conjunctivitis, bilateral: Secondary | ICD-10-CM | POA: Diagnosis not present

## 2016-04-27 MED FILL — PREDNISOLONE AC 1% EYE DROP: 1 | 13 days supply | Qty: 5 | Fill #0

## 2016-05-11 DIAGNOSIS — H10413 Chronic giant papillary conjunctivitis, bilateral: Secondary | ICD-10-CM | POA: Diagnosis not present

## 2016-07-02 ENCOUNTER — Encounter: Payer: Self-pay | Admitting: Internal Medicine

## 2016-07-02 ENCOUNTER — Ambulatory Visit (INDEPENDENT_AMBULATORY_CARE_PROVIDER_SITE_OTHER): Payer: 59 | Admitting: Internal Medicine

## 2016-07-02 VITALS — BP 108/62 | HR 67 | Temp 98.1°F | Ht 71.0 in | Wt 245.6 lb

## 2016-07-02 DIAGNOSIS — Z0000 Encounter for general adult medical examination without abnormal findings: Secondary | ICD-10-CM | POA: Diagnosis not present

## 2016-07-02 LAB — CBC WITH DIFFERENTIAL/PLATELET
BASOS ABS: 0 10*3/uL (ref 0.0–0.1)
Basophils Relative: 0.2 % (ref 0.0–3.0)
EOS PCT: 2 % (ref 0.0–5.0)
Eosinophils Absolute: 0.1 10*3/uL (ref 0.0–0.7)
HCT: 46.2 % (ref 39.0–52.0)
Hemoglobin: 15.7 g/dL (ref 13.0–17.0)
Lymphocytes Relative: 19.2 % (ref 12.0–46.0)
Lymphs Abs: 1.3 10*3/uL (ref 0.7–4.0)
MCHC: 34 g/dL (ref 30.0–36.0)
MCV: 91.2 fl (ref 78.0–100.0)
MONOS PCT: 6.6 % (ref 3.0–12.0)
Monocytes Absolute: 0.5 10*3/uL (ref 0.1–1.0)
Neutro Abs: 5 10*3/uL (ref 1.4–7.7)
Neutrophils Relative %: 72 % (ref 43.0–77.0)
Platelets: 256 10*3/uL (ref 150.0–400.0)
RBC: 5.07 Mil/uL (ref 4.22–5.81)
RDW: 13.7 % (ref 11.5–15.5)
WBC: 7 10*3/uL (ref 4.0–10.5)

## 2016-07-02 LAB — COMPREHENSIVE METABOLIC PANEL
ALK PHOS: 73 U/L (ref 39–117)
ALT: 25 U/L (ref 0–53)
AST: 18 U/L (ref 0–37)
Albumin: 4.4 g/dL (ref 3.5–5.2)
BILIRUBIN TOTAL: 0.7 mg/dL (ref 0.2–1.2)
BUN: 12 mg/dL (ref 6–23)
CALCIUM: 9.7 mg/dL (ref 8.4–10.5)
CO2: 30 mEq/L (ref 19–32)
Chloride: 100 mEq/L (ref 96–112)
Creatinine, Ser: 1.25 mg/dL (ref 0.40–1.50)
GFR: 79.86 mL/min (ref >60.00)
Glucose, Bld: 85 mg/dL (ref 70–99)
POTASSIUM: 4.1 meq/L (ref 3.5–5.1)
Sodium: 139 mEq/L (ref 135–145)
TOTAL PROTEIN: 7.3 g/dL (ref 6.0–8.3)

## 2016-07-02 LAB — LIPID PANEL
CHOL/HDL RATIO: 5
Cholesterol: 199 mg/dL (ref 0–200)
HDL: 38.2 mg/dL — AB (ref >39.00)
LDL Cholesterol: 140 mg/dL — ABNORMAL HIGH (ref 0–99)
NONHDL: 161.09
TRIGLYCERIDES: 105 mg/dL (ref 0.0–149.0)
VLDL: 21 mg/dL (ref 0.0–40.0)

## 2016-07-02 LAB — TSH: TSH: 1.61 u[IU]/mL (ref 0.35–4.50)

## 2016-07-02 NOTE — Patient Instructions (Addendum)
WE NOW OFFER   [name] FAST TRACK!!!  SAME DAY Appointments for ACUTE CARE  Such as: Sprains, Injuries, cuts, abrasions, rashes, muscle pain, joint pain, back pain Colds, flu, sore throats, headache, allergies, cough, fever  Ear pain, sinus and eye infections Abdominal pain, nausea, vomiting, diarrhea, upset stomach Animal/insect bites  3 Easy Ways to Schedule: Walk-In Scheduling Call in scheduling Mychart Sign-up: [email or web address]   Limit your sodium (Salt) intake  Please check your blood pressure on a regular basis.  If it is consistently greater than 150/90, please make an office appointment.    It is important that you exercise regularly, at least 20 minutes 3 to 4 times per week.  If you develop chest pain or shortness of breath seek  medical attention.  Return in one year for follow-up       

## 2016-07-02 NOTE — Progress Notes (Signed)
Subjective:    Patient ID: Carlos Burns, male    DOB: Sep 07, 1970, 46 y.o.   MRN: [id number]  HPI  46 year old patient who is seen today for a preventive health examination He enjoys excellent health.  He has history of mild episodic asthma and a history of essential hypertension.  He has obesity. No concerns or complaints.  Wt Readings from Last 3 Encounters:  07/02/16 245 lb 9.6 oz (111.4 kg)  12/16/15 260 lb (117.9 kg)  06/24/15 252 lb (114.3 kg)    Current Allergies:  No known allergies   Past Medical History:   Asthma  testicular nodule   Past Surgical History:  wisdom teeth, extraction   Family History:  Reviewed history and no changes required:  father age 60  history of hypertension  mother, age 65   in good health DM2 Three of 4 grandparents deceased; positive for cerebrovascular disease, diabetes, and hypertension  One sister two brothers in good health-sister. History of systolic murmur   Social History:   Remarried. One 31 year old daughter and one 56-year-old son Never Smoked  IT [facility]  Regular exercise-yes  Risk Factors:  Tobacco use: never   Review of Systems  Constitutional: Negative for appetite change, chills, fatigue and fever.  HENT: Negative for congestion, dental problem, ear pain, hearing loss, sore throat, tinnitus, trouble swallowing and voice change.   Eyes: Negative for pain, discharge and visual disturbance.  Respiratory: Negative for cough, chest tightness, wheezing and stridor.   Cardiovascular: Negative for chest pain, palpitations and leg swelling.  Gastrointestinal: Negative for abdominal distention, abdominal pain, blood in stool, constipation, diarrhea, nausea and vomiting.  Genitourinary: Negative for difficulty urinating, discharge, flank pain, genital sores, hematuria and urgency.  Musculoskeletal: Negative for arthralgias, back pain, gait problem, joint swelling, myalgias and neck stiffness.  Skin: Negative for  rash.  Neurological: Negative for dizziness, syncope, speech difficulty, weakness, numbness and headaches.  Hematological: Negative for adenopathy. Does not bruise/bleed easily.  Psychiatric/Behavioral: Negative for behavioral problems and dysphoric mood. The patient is not nervous/anxious.        Objective:   Physical Exam  Constitutional: He appears well-developed and well-nourished.  HENT:  Head: Normocephalic and atraumatic.  Right Ear: External ear normal.  Left Ear: External ear normal.  Nose: Nose normal.  Mouth/Throat: Oropharynx is clear and moist.  Eyes: Conjunctivae and EOM are normal. Pupils are equal, round, and reactive to light. No scleral icterus.  Neck: Normal range of motion. Neck supple. No JVD present. No thyromegaly present.  Cardiovascular: Regular rhythm, normal heart sounds and intact distal pulses.  Exam reveals no gallop and no friction rub.   No murmur heard. Pulmonary/Chest: Effort normal and breath sounds normal. He exhibits no tenderness.  Abdominal: Soft. Bowel sounds are normal. He exhibits no distension and no mass. There is no tenderness.  Genitourinary: Penis normal.  Musculoskeletal: Normal range of motion. He exhibits no edema or tenderness.  Lymphadenopathy:    He has no cervical adenopathy.  Neurological: He is alert. He has normal reflexes. No cranial nerve deficit. Coordination normal.  Skin: Skin is warm and dry. No rash noted.  Psychiatric: He has a normal mood and affect. His behavior is normal.          Assessment & Plan:   Preventive health examination.  We'll check updated lab Obesity.  Patient has had a nice weight loss over the past 6 months.  Patient encouraged to continue efforts Essential hypertension, stable  Continue home blood pressure  monitoring Low-salt diet recommended Follow-up one year or as needed  Rogelia BogaKWIATKOWSKI,Jissell Trafton FRANK

## 2016-09-09 ENCOUNTER — Other Ambulatory Visit: Payer: Self-pay | Admitting: Internal Medicine

## 2016-09-09 MED FILL — LISINOPRIL-HCTZ 10-12.5 MG: 10-12.5 | 90 days supply | Qty: 90 | Fill #0

## 2016-09-30 ENCOUNTER — Encounter: Payer: Self-pay | Admitting: Internal Medicine

## 2016-10-13 DIAGNOSIS — H5213 Myopia, bilateral: Secondary | ICD-10-CM | POA: Diagnosis not present

## 2016-12-31 MED FILL — LISINOPRIL-HCTZ 10-12.5 MG: 10-12.5 | 90 days supply | Qty: 90 | Fill #1

## 2017-02-11 ENCOUNTER — Encounter: Payer: Self-pay | Admitting: Adult Health

## 2017-02-11 ENCOUNTER — Ambulatory Visit (INDEPENDENT_AMBULATORY_CARE_PROVIDER_SITE_OTHER): Payer: No Typology Code available for payment source | Admitting: Adult Health

## 2017-02-11 VITALS — BP 110/70 | HR 65 | Temp 98.3°F | Ht 71.0 in | Wt 251.0 lb

## 2017-02-11 DIAGNOSIS — J329 Chronic sinusitis, unspecified: Secondary | ICD-10-CM | POA: Diagnosis not present

## 2017-02-11 DIAGNOSIS — B9789 Other viral agents as the cause of diseases classified elsewhere: Secondary | ICD-10-CM

## 2017-02-11 NOTE — Progress Notes (Signed)
Subjective:    Patient ID: Carlos Burns, male    DOB: [birth year], 47 y.o.   MRN: [id number]  Sinus Problem  This is a new problem. The current episode started in the past 7 days (3 days ). The problem has been gradually improving since onset. There has been no fever. Associated symptoms include congestion, headaches, neck pain and sinus pressure. Pertinent negatives include no chills, coughing, ear pain, shortness of breath, sore throat or swollen glands. Past treatments include oral decongestants. The treatment provided mild relief.      Review of Systems  Constitutional: Negative for chills.  HENT: Positive for congestion and sinus pressure. Negative for ear pain, sinus pain and sore throat.   Respiratory: Negative for cough and shortness of breath.   Cardiovascular: Negative.   Gastrointestinal: Negative.   Musculoskeletal: Positive for neck pain.  Neurological: Positive for headaches.   Past Medical History:  Diagnosis Date  . ASTHMA 02/29/2008  . DERMATITIS 06/20/2009  . Headache(784.0) 06/25/2009    Social History   Socioeconomic History  . Marital status: Married    Spouse name: Not on file  . Number of children: Not on file  . Years of education: Not on file  . Highest education level: Not on file  Social Needs  . Financial resource strain: Not on file  . Food insecurity - worry: Not on file  . Food insecurity - inability: Not on file  . Transportation needs - medical: Not on file  . Transportation needs - non-medical: Not on file  Occupational History  . Not on file  Tobacco Use  . Smoking status: Never Smoker  . Smokeless tobacco: Never Used  Substance and Sexual Activity  . Alcohol use: Yes  . Drug use: No  . Sexual activity: Not on file  Other Topics Concern  . Not on file  Social History Narrative  . Not on file    History reviewed. No pertinent surgical history.  Family History  Problem Relation Age of Onset  . Hypertension Father   .  Stroke Maternal Grandmother   . Hypertension Maternal Grandmother   . Stroke Maternal Grandfather   . Diabetes Maternal Grandfather   . Stroke Paternal Grandmother   . Diabetes Paternal Grandmother   . Hypertension Paternal Grandmother   . Diabetes Paternal Grandfather   . Hypertension Paternal Grandfather     No Known Allergies  Current Outpatient Medications on File Prior to Visit  Medication Sig Dispense Refill  . Albuterol Sulfate (PROAIR RESPICLICK) 108 (90 BASE) MCG/ACT AEPB Inhale 2 sprays into the lungs every 6 (six) hours as needed. 1 each 6  . cetirizine (ZYRTEC) 10 MG tablet Take 10 mg by mouth as needed.     . Fluticasone-Salmeterol (ADVAIR) 100-50 MCG/DOSE AEPB Inhale 1 puff into the lungs as needed. 60 each 12  . lisinopril-hydrochlorothiazide (PRINZIDE,ZESTORETIC) 10-12.5 MG tablet TAKE 1 TABLET BY MOUTH DAILY. 90 tablet 3   No current facility-administered medications on file prior to visit.     BP 110/70 (BP Location: Right Arm, Patient Position: Sitting, Cuff Size: Large)   Pulse 65   Temp 98.3 F (36.8 C) (Oral)   Ht 5\' 11"  (1.803 m)   Wt 251 lb (113.9 kg)   SpO2 98%   BMI 35.01 kg/m       Objective:   Physical Exam  Constitutional: He is oriented to person, place, and time. He appears well-developed and well-nourished. No distress.  HENT:  Right Ear: Hearing, tympanic  membrane, external ear and ear canal normal.  Left Ear: Hearing, tympanic membrane, external ear and ear canal normal.  Nose: Mucosal edema present. No rhinorrhea. Right sinus exhibits no maxillary sinus tenderness and no frontal sinus tenderness. Left sinus exhibits no maxillary sinus tenderness and no frontal sinus tenderness.  Mouth/Throat: Uvula is midline, oropharynx is clear and moist and mucous membranes are normal.  Cardiovascular: Normal rate, regular rhythm, normal heart sounds and intact distal pulses. Exam reveals no gallop and no friction rub.  No murmur  heard. Pulmonary/Chest: Effort normal and breath sounds normal. No respiratory distress. He has no wheezes. He has no rales. He exhibits no tenderness.  Neurological: He is alert and oriented to person, place, and time.  Skin: Skin is warm and dry. No rash noted. He is not diaphoretic. No erythema. No pallor.  Psychiatric: He has a normal mood and affect. His behavior is normal. Judgment and thought content normal.  Nursing note and vitals reviewed.         Assessment & Plan:  1. Viral sinusitis - Educated on the natural progression of his symptoms - Advised Flonase to help with his symptoms  - Follow up in 2-3 days if no improvement  - Rest and stay hydrated   Shirline Freesory Gaetana Kawahara, NP

## 2017-03-28 ENCOUNTER — Encounter: Payer: Self-pay | Admitting: Internal Medicine

## 2017-03-28 ENCOUNTER — Ambulatory Visit (INDEPENDENT_AMBULATORY_CARE_PROVIDER_SITE_OTHER): Payer: No Typology Code available for payment source | Admitting: Internal Medicine

## 2017-03-28 VITALS — BP 86/62 | HR 69 | Temp 98.2°F | Ht 71.0 in | Wt 250.0 lb

## 2017-03-28 DIAGNOSIS — R311 Benign essential microscopic hematuria: Secondary | ICD-10-CM | POA: Diagnosis not present

## 2017-03-28 DIAGNOSIS — R1010 Upper abdominal pain, unspecified: Secondary | ICD-10-CM

## 2017-03-28 DIAGNOSIS — I10 Essential (primary) hypertension: Secondary | ICD-10-CM

## 2017-03-28 DIAGNOSIS — M79621 Pain in right upper arm: Secondary | ICD-10-CM

## 2017-03-28 DIAGNOSIS — R3989 Other symptoms and signs involving the genitourinary system: Secondary | ICD-10-CM

## 2017-03-28 NOTE — Patient Instructions (Signed)
Call or return to clinic prn if these symptoms worsen or fail to improve as anticipated.  Return as scheduled for your annual exam  You need to lose weight.  Consider a lower calorie diet and regular exercise.    It is important that you exercise regularly, at least 20 minutes 3 to 4 times per week.  If you develop chest pain or shortness of breath seek  medical attention.  Please check your blood pressure on a regular basis.  If it is consistently greater than 140/90, please make an office appointment.

## 2017-03-28 NOTE — Progress Notes (Signed)
Subjective:    Patient ID: Carlos Burns, male    DOB: Dec 07, 1970, 47 y.o.   MRN: [id number]  HPI 47 year old patient who has a history of hypertension and asthma. He presents with complaints of bilateral axillary discomfort the right greater than the left this has been present for several days and does not seem to be worsening.  He wonders if it is caused by the way he is sleeping.  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 ASTHMA 02/29/2008  . DERMATITIS 06/20/2009  . Headache(784.0) 06/25/2009     Social History   Socioeconomic History  . Marital status: Married    Spouse name: Not on file  . Number of children: Not on file  . Years of education: Not on file  . Highest education level: Not on file  Social Needs  . Financial resource strain: Not on file  . Food insecurity - worry: Not on file  . Food insecurity - inability: Not on file  . Transportation needs - medical: Not on file  . Transportation needs - non-medical: Not on file  Occupational History  . Not on file  Tobacco Use  . Smoking status: Never Smoker  . Smokeless tobacco: Never Used  Substance and Sexual Activity  . Alcohol use: Yes  . Drug use: No  . Sexual activity: Not on file  Other Topics Concern  . Not on file  Social History Narrative  . Not on file    History reviewed. No pertinent surgical history.  Family History  Problem Relation Age of Onset  . Hypertension Father   . Stroke Maternal Grandmother   . Hypertension Maternal Grandmother   . Stroke Maternal Grandfather   . Diabetes Maternal Grandfather   . Stroke Paternal Grandmother   . Diabetes Paternal Grandmother   . Hypertension Paternal Grandmother   . Diabetes Paternal Grandfather   . Hypertension Paternal Grandfather     No Known Allergies  Current  Outpatient Medications on File Prior to Visit  Medication Sig Dispense Refill  . Albuterol Sulfate (PROAIR RESPICLICK) 108 (90 BASE) MCG/ACT AEPB Inhale 2 sprays into the lungs every 6 (six) hours as needed. 1 each 6  . cetirizine (ZYRTEC) 10 MG tablet Take 10 mg by mouth as needed.     . Fluticasone-Salmeterol (ADVAIR) 100-50 MCG/DOSE AEPB Inhale 1 puff into the lungs as needed. 60 each 12  . lisinopril-hydrochlorothiazide (PRINZIDE,ZESTORETIC) 10-12.5 MG tablet TAKE 1 TABLET BY MOUTH DAILY. 90 tablet 3   No current facility-administered medications on file prior to visit.     BP (!) 86/62 (BP Location: Left Arm, Patient Position: Sitting, Cuff Size: Large)   Pulse 69   Temp 98.2 F (36.8 C) (Oral)   Ht 5\' 11"  (1.803 m)   Wt 250 lb (113.4 kg)   SpO2 97%   BMI 34.87 kg/m      Review of Systems  Constitutional: Negative for appetite change, chills, fatigue and fever.  HENT: Negative for congestion, dental problem, ear pain, hearing loss, sore throat, tinnitus, trouble swallowing and voice change.   Eyes: Negative for pain, discharge and visual disturbance.  Respiratory: Negative for cough, chest tightness, wheezing and stridor.   Cardiovascular: Negative for chest pain, palpitations and leg swelling.  Gastrointestinal: Positive for abdominal pain. Negative for abdominal distention, blood in stool, constipation, diarrhea, nausea  and vomiting.  Genitourinary: Negative for difficulty urinating, discharge, flank pain, genital sores, hematuria and urgency.  Musculoskeletal: Negative for arthralgias, back pain, gait problem, joint swelling, myalgias and neck stiffness.  Skin: Negative for rash.  Neurological: Negative for dizziness, syncope, speech difficulty, weakness, numbness and headaches.  Hematological: Negative for adenopathy. Does not bruise/bleed easily.  Psychiatric/Behavioral: Negative for behavioral problems and dysphoric mood. The patient is not nervous/anxious.          Objective:   Physical Exam  Constitutional: He is oriented to person, place, and time. He appears well-developed. No distress.  Repeat blood pressure 120/84 Weight 250  HENT:  Head: Normocephalic.  Right Ear: External ear normal.  Left Ear: External ear normal.  Eyes: Conjunctivae and EOM are normal.  Neck: Normal range of motion.  Cardiovascular: Normal rate and normal heart sounds.  Pulmonary/Chest: Breath sounds normal.  Axilla examined and unremarkable.  No tenderness or adenopathy no evidence of folliculitis or lymphadenitis  Abdominal: Bowel sounds are normal. He exhibits no distension. There is no tenderness. There is no rebound.  Abdomen obese soft and nontender  Musculoskeletal: Normal range of motion. He exhibits no edema or tenderness.  Neurological: He is alert and oriented to person, place, and time.  Psychiatric: He has a normal mood and affect. His behavior is normal.          Assessment & Plan:   Nonspecific axillary pain bilateral Nonspecific abdominal pain maximal in the flank area Obesity  essential hypertension stable    Weight loss encouraged  patient report any new or worsening symptoms Return as scheduled for annual exam  Rogelia BogaKWIATKOWSKI,Amair Shrout FRANK

## 2017-03-29 LAB — POCT URINALYSIS DIPSTICK
BILIRUBIN UA: NEGATIVE
GLUCOSE UA: NEGATIVE
Ketones, UA: NEGATIVE
Leukocytes, UA: NEGATIVE
Nitrite, UA: NEGATIVE
Protein, UA: NEGATIVE
Spec Grav, UA: 1.025 (ref 1.010–1.025)
Urobilinogen, UA: 0.2 E.U./dL
pH, UA: 6 (ref 5.0–8.0)

## 2017-03-29 NOTE — Addendum Note (Signed)
Addended by: Waymon AmatoJOHNSON, KENDRA R on: 03/29/2017 09:58 AM   Modules accepted: Orders

## 2017-05-17 MED FILL — LISINOPRIL-HCTZ 10-12.5 MG: 10-12.5 | 90 days supply | Qty: 90 | Fill #2

## 2017-05-19 ENCOUNTER — Other Ambulatory Visit: Payer: Self-pay

## 2017-05-19 ENCOUNTER — Telehealth: Payer: Self-pay | Admitting: Internal Medicine

## 2017-05-19 ENCOUNTER — Other Ambulatory Visit: Payer: Self-pay | Admitting: Internal Medicine

## 2017-05-19 MED ORDER — ALBUTEROL SULFATE 108 (90 BASE) MCG/ACT IN AEPB: 2.0000 | INHALATION_SPRAY | Freq: Four times a day (QID) | RESPIRATORY_TRACT | 6 refills | Status: DC | PRN

## 2017-05-19 MED FILL — PROAIR RESPICLICK INHAL PWD: 108 (90 BAS | 25 days supply | Qty: 1 | Fill #0

## 2017-05-19 NOTE — Telephone Encounter (Signed)
Copied from CRM [id number]. Topic: Quick Communication - Rx Refill/Question >> May 19, 2017 12:57 PM Diana Eves B wrote: Medication: Albuterol Sulfate (PROAIR RESPICLICK) 108 (90 BASE) MCG/ACT AEPB                         Fluticasone-Salmeterol (ADVAIR) 100-50 MCG/DOSE AEPB  Both scripts have expired.   Has the patient contacted their pharmacy? Yes.   (Agent: If no, request that the patient contact the pharmacy for the refill.) Preferred Pharmacy (with phone number or street name): [facility] - [place], [place] - [address] Agent: Please be advised that RX refills may take up to 3 business days. We ask that you follow-up with your pharmacy.

## 2017-05-20 MED FILL — ADVAIR 100/50 DISKUS: 100-50 | 30 days supply | Qty: 60 | Fill #0

## 2017-07-20 ENCOUNTER — Encounter: Payer: Self-pay | Admitting: Internal Medicine

## 2017-07-20 ENCOUNTER — Ambulatory Visit (INDEPENDENT_AMBULATORY_CARE_PROVIDER_SITE_OTHER): Payer: No Typology Code available for payment source | Admitting: Internal Medicine

## 2017-07-20 VITALS — BP 128/87 | HR 64 | Temp 98.2°F | Wt 256.0 lb

## 2017-07-20 DIAGNOSIS — R6889 Other general symptoms and signs: Secondary | ICD-10-CM | POA: Diagnosis not present

## 2017-07-20 NOTE — Progress Notes (Signed)
Chief Complaint  Patient presents with  . ear sounds right    HPI: Carlos KocherMitchell B Burns 47 y.o. SDA  PCP appt NA  Last night   Usually head phones and noted   Noted a pulsing  In right ear and then laid down and then thumping hearing   erattic  Thump in right ear  When lays oon right side.   Hx of tinnitus   Not flare of this.    f  Ringing if loud noise.    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   No congestion fever  Allergy .  Sx obvious    no exercise  Related sx .  ROS: See pertinent positives and negatives per HPI.  Past Medical History:  Diagnosis Date  . ASTHMA 02/29/2008  . DERMATITIS 06/20/2009  . Headache(784.0) 06/25/2009    Family History  Problem Relation Age of Onset  . Hypertension Father   . Stroke Maternal Grandmother   . Hypertension Maternal Grandmother   . Stroke Maternal Grandfather   . Diabetes Maternal Grandfather   . Stroke Paternal Grandmother   . Diabetes Paternal Grandmother   . Hypertension Paternal Grandmother   . Diabetes Paternal Grandfather   . Hypertension Paternal Grandfather     Social History   Socioeconomic History  . Marital status: Married    Spouse name: Not on file  . Number of children: Not on file  . Years of education: Not on file  . Highest education level: Not on file  Occupational History  . Not on file  Social Needs  . Financial resource strain: Not on file  . Food insecurity:    Worry: Not on file    Inability: Not on file  . Transportation needs:    Medical: Not on file    Non-medical: Not on file  Tobacco Use  . Smoking status: Never Smoker  . Smokeless tobacco: Never Used  Substance and Sexual Activity  . Alcohol use: Yes  . Drug use: No  . Sexual activity: Not on file  Lifestyle  . Physical activity:    Days per week: Not on file    Minutes per session: Not on file  . Stress: Not on file  Relationships  . Social connections:    Talks on phone: Not on file    Gets together: Not on  file    Attends religious service: Not on file    Active member of club or organization: Not on file    Attends meetings of clubs or organizations: Not on file    Relationship status: Not on file  Other Topics Concern  . Not on file  Social History Narrative  . Not on file    Outpatient Medications Prior to Visit  Medication Sig Dispense Refill  . ADVAIR DISKUS 100-50 MCG/DOSE AEPB INHALE 1 PUFF INTO THE LUNGS AS NEEDED. 60 each 12  . Albuterol Sulfate (PROAIR RESPICLICK) 108 (90 Base) MCG/ACT AEPB Inhale 2 sprays into the lungs every 6 (six) hours as needed. 1 each 6  . cetirizine (ZYRTEC) 10 MG tablet Take 10 mg by mouth as needed.     Marland Kitchen. lisinopril-hydrochlorothiazide (PRINZIDE,ZESTORETIC) 10-12.5 MG tablet TAKE 1 TABLET BY MOUTH DAILY. 90 tablet 3   No facility-administered medications prior to visit.      EXAM:  BP 128/87   Pulse 64   Temp 98.2 F (36.8 C)   Wt 256 lb (116.1 kg)   BMI 35.70  kg/m   Body mass index is 35.7 kg/m.  GENERAL: vitals reviewed and listed above, alert, oriented, appears well hydrated and in no acute distress HEENT: atraumatic, conjunctiva  clear, no obvious abnormalities on inspection of external nose and ears  tmx clear   Nl OP : no lesion edema or exudate  NECK: no obvious masses on inspection palpation  No bruits and no cranial bruits   LUNGS: clear to auscultation bilaterally, no wheezes, rales or rhonchi, good air movement CV: HRRR, no clubbing cyanosis or  peripheral edema nl cap refill  MS: moves all extremities without noticeable focal  abnormality PSYCH: pleasant and cooperative, no obvious depression or anxiety Hearing tests see results are good   10 15 all frequency .   Hearing Screening   125Hz  250Hz  500Hz  1000Hz  2000Hz  3000Hz  4000Hz  6000Hz  8000Hz   Right ear:   10 10 15  10     Left ear:   5 20 5  5        ASSESSMENT AND PLAN:  Discussed the following assessment and plan:  Ear symptom - does not seem pusatile tinnitus poss    related     typanospasm exam is reassuring and normal  Apparently has some baseline tinnitus and does ear protection   No obv vascular  Sx or on exam  As is irreg and not from heart beat?    Add flonase   Trial and fu Dr Kirtland Bouchard if  persistent or progressive  -Patient advised to return or notify health care team  if symptoms worsen ,persist or new concerns arise.  Patient Instructions  Your  Exam is reassuring   And hearing    Is normal.   Make a trial of flonase nasal spray  Every day for the next 2 weeks . If  persistent or progressive plan ROV with Dr Kirtland Bouchard   Or   ENT  ( we can refer)   But  dont see any alarming   Findings today .         Neta Mends. Carlos Burns M.D.

## 2017-07-20 NOTE — Patient Instructions (Addendum)
Your  Exam is reassuring   And hearing    Is normal.   Make a trial of flonase nasal spray  Every day for the next 2 weeks . If  persistent or progressive plan ROV with Dr Kirtland BouchardK   Or   ENT  ( we can refer)   But  dont see any alarming   Findings today .

## 2017-08-02 ENCOUNTER — Ambulatory Visit (INDEPENDENT_AMBULATORY_CARE_PROVIDER_SITE_OTHER): Payer: No Typology Code available for payment source | Admitting: Internal Medicine

## 2017-08-02 ENCOUNTER — Encounter: Payer: Self-pay | Admitting: Internal Medicine

## 2017-08-02 VITALS — BP 118/62 | HR 82 | Temp 98.0°F | Ht 71.5 in | Wt 242.6 lb

## 2017-08-02 DIAGNOSIS — Z0000 Encounter for general adult medical examination without abnormal findings: Secondary | ICD-10-CM | POA: Diagnosis not present

## 2017-08-02 LAB — COMPREHENSIVE METABOLIC PANEL
ALT: 25 U/L (ref 0–53)
AST: 25 U/L (ref 0–37)
Albumin: 4.2 g/dL (ref 3.5–5.2)
Alkaline Phosphatase: 66 U/L (ref 39–117)
BUN: 15 mg/dL (ref 6–23)
CO2: 30 mEq/L (ref 19–32)
Calcium: 9.2 mg/dL (ref 8.4–10.5)
Chloride: 103 mEq/L (ref 96–112)
Creatinine, Ser: 1.34 mg/dL (ref 0.40–1.50)
GFR: 73.36 mL/min (ref >60.00)
GLUCOSE: 85 mg/dL (ref 70–99)
Potassium: 3.9 mEq/L (ref 3.5–5.1)
SODIUM: 139 meq/L (ref 135–145)
Total Bilirubin: 0.6 mg/dL (ref 0.2–1.2)
Total Protein: 7.3 g/dL (ref 6.0–8.3)

## 2017-08-02 LAB — CBC WITH DIFFERENTIAL/PLATELET
Basophils Absolute: 0 10*3/uL (ref 0.0–0.1)
Basophils Relative: 0.4 % (ref 0.0–3.0)
Eosinophils Absolute: 0.1 10*3/uL (ref 0.0–0.7)
Eosinophils Relative: 1.3 % (ref 0.0–5.0)
HCT: 44.3 % (ref 39.0–52.0)
Hemoglobin: 14.8 g/dL (ref 13.0–17.0)
LYMPHS ABS: 1.7 10*3/uL (ref 0.7–4.0)
Lymphocytes Relative: 23.9 % (ref 12.0–46.0)
MCHC: 33.5 g/dL (ref 30.0–36.0)
MCV: 92.9 fl (ref 78.0–100.0)
MONO ABS: 0.5 10*3/uL (ref 0.1–1.0)
Monocytes Relative: 6.8 % (ref 3.0–12.0)
Neutro Abs: 4.7 10*3/uL (ref 1.4–7.7)
Neutrophils Relative %: 67.6 % (ref 43.0–77.0)
Platelets: 243 10*3/uL (ref 150.0–400.0)
RBC: 4.76 Mil/uL (ref 4.22–5.81)
RDW: 14.1 % (ref 11.5–15.5)
WBC: 6.9 10*3/uL (ref 4.0–10.5)

## 2017-08-02 LAB — LIPID PANEL
Cholesterol: 166 mg/dL (ref 0–200)
HDL: 42.3 mg/dL (ref >39.00)
LDL Cholesterol: 109 mg/dL — ABNORMAL HIGH (ref 0–99)
NonHDL: 123.34
Total CHOL/HDL Ratio: 4
Triglycerides: 72 mg/dL (ref 0.0–149.0)
VLDL: 14.4 mg/dL (ref 0.0–40.0)

## 2017-08-02 LAB — TSH: TSH: 1.41 u[IU]/mL (ref 0.35–4.50)

## 2017-08-02 MED ORDER — LISINOPRIL-HYDROCHLOROTHIAZIDE 10-12.5 MG PO TABS: 1.0000 | ORAL_TABLET | Freq: Every day | ORAL | 3 refills | Status: DC

## 2017-08-02 MED ORDER — ALBUTEROL SULFATE 108 (90 BASE) MCG/ACT IN AEPB: 2.0000 | INHALATION_SPRAY | Freq: Four times a day (QID) | RESPIRATORY_TRACT | 6 refills | Status: DC | PRN

## 2017-08-02 MED ORDER — FLUTICASONE-SALMETEROL 100-50 MCG/DOSE IN AEPB: INHALATION_SPRAY | RESPIRATORY_TRACT | 12 refills | Status: DC

## 2017-08-02 MED FILL — LISINOPRIL-HCTZ 10-12.5 MG: 10-12.5 | 90 days supply | Qty: 90 | Fill #0

## 2017-08-02 MED FILL — ADVAIR 100/50 DISKUS: 100-50 | 30 days supply | Qty: 60 | Fill #0

## 2017-08-02 MED FILL — PROAIR RESPICLICK INHAL PWD: 108 (90 BAS | 25 days supply | Qty: 1 | Fill #0

## 2017-08-02 NOTE — Patient Instructions (Signed)
Limit your sodium (Salt) intake  Please check your blood pressure on a regular basis.  If it is consistently greater than 140/90, please make an office appointment.    It is important that you exercise regularly, at least 20 minutes 3 to 4 times per week.  If you develop chest pain or shortness of breath seek  medical attention.  You need to lose weight.  Consider a lower calorie diet and regular exercise. 

## 2017-08-02 NOTE — Progress Notes (Signed)
Subjective:    Patient ID: Carlos Burns, male    DOB: [birth year], 47 y.o.   MRN: [id number]  HPI  47 year old patient who has a history of stable asthma who is seen today for a annual health assessment.  He has a history of essential hypertension which has been well controlled.  No concerns or complaints.  Current Allergies:  No known allergies   Past Medical History:   Asthma  testicular nodule   Past Surgical History:  wisdom teeth, extraction   Family History:  Reviewed history and no changes required:  father age 44  history of hypertension  mother, age 68  in good health DM2 Three of 4 grandparents deceased; positive for cerebrovascular disease, diabetes, and hypertension  One sister two brothers in good health-sister. History of systolic murmur   Social History:   Remarried. One 49 year old daughter and one 12-year-old son Never Smoked  IT [facility]  Regular exercise-yes  Risk Factors:  Tobacco use: never    Past Medical History:  Diagnosis Date  . ASTHMA 02/29/2008  . DERMATITIS 06/20/2009  . Headache(784.0) 06/25/2009     Social History   Socioeconomic History  . Marital status: Married    Spouse name: Not on file  . Number of children: Not on file  . Years of education: Not on file  . Highest education level: Not on file  Occupational History  . Not on file  Social Needs  . Financial resource strain: Not on file  . Food insecurity:    Worry: Not on file    Inability: Not on file  . Transportation needs:    Medical: Not on file    Non-medical: Not on file  Tobacco Use  . Smoking status: Never Smoker  . Smokeless tobacco: Never Used  Substance and Sexual Activity  . Alcohol use: Yes  . Drug use: No  . Sexual activity: Not on file  Lifestyle  . Physical activity:    Days per week: Not on file    Minutes per session: Not on file  . Stress: Not on file  Relationships  . Social connections:    Talks on phone: Not on file    Gets  together: Not on file    Attends religious service: Not on file    Active member of club or organization: Not on file    Attends meetings of clubs or organizations: Not on file    Relationship status: Not on file  . Intimate partner violence:    Fear of current or ex partner: Not on file    Emotionally abused: Not on file    Physically abused: Not on file    Forced sexual activity: Not on file  Other Topics Concern  . Not on file  Social History Narrative  . Not on file    History reviewed. No pertinent surgical history.  Family History  Problem Relation Age of Onset  . Hypertension Father   . Stroke Maternal Grandmother   . Hypertension Maternal Grandmother   . Stroke Maternal Grandfather   . Diabetes Maternal Grandfather   . Stroke Paternal Grandmother   . Diabetes Paternal Grandmother   . Hypertension Paternal Grandmother   . Diabetes Paternal Grandfather   . Hypertension Paternal Grandfather     No Known Allergies  Current Outpatient Medications on File Prior to Visit  Medication Sig Dispense Refill  . cetirizine (ZYRTEC) 10 MG tablet Take 10 mg by mouth as needed.  No current facility-administered medications on file prior to visit.     BP 118/62 (BP Location: Left Arm, Patient Position: Sitting, Cuff Size: Large)   Pulse 82   Temp 98 F (36.7 C) (Oral)   Ht 5' 11.5" (1.816 m)   Wt 242 lb 9.6 oz (110 kg)   SpO2 96%   BMI 33.36 kg/m     Review of Systems  Constitutional: Negative for appetite change, chills, fatigue and fever.  HENT: Negative for congestion, dental problem, ear pain, hearing loss, sore throat, tinnitus, trouble swallowing and voice change.   Eyes: Negative for pain, discharge and visual disturbance.  Respiratory: Negative for cough, chest tightness, wheezing and stridor.   Cardiovascular: Negative for chest pain, palpitations and leg swelling.  Gastrointestinal: Negative for abdominal distention, abdominal pain, blood in stool,  constipation, diarrhea, nausea and vomiting.  Genitourinary: Negative for difficulty urinating, discharge, flank pain, genital sores, hematuria and urgency.  Musculoskeletal: Negative for arthralgias, back pain, gait problem, joint swelling, myalgias and neck stiffness.  Skin: Negative for rash.  Neurological: Negative for dizziness, syncope, speech difficulty, weakness, numbness and headaches.  Hematological: Negative for adenopathy. Does not bruise/bleed easily.  Psychiatric/Behavioral: Negative for behavioral problems and dysphoric mood. The patient is not nervous/anxious.        Objective:   Physical Exam  Constitutional: He is oriented to person, place, and time. He appears well-developed and well-nourished.  Blood pressure 120/70 Weight 242  HENT:  Head: Normocephalic and atraumatic.  Right Ear: External ear normal.  Left Ear: External ear normal.  Nose: Nose normal.  Mouth/Throat: Oropharynx is clear and moist.  Eyes: Pupils are equal, round, and reactive to light. Conjunctivae and EOM are normal. No scleral icterus.  Neck: Normal range of motion. Neck supple. No JVD present. No thyromegaly present.  Cardiovascular: Normal rate, regular rhythm, normal heart sounds and intact distal pulses. Exam reveals no gallop and no friction rub.  No murmur heard. Pulmonary/Chest: Effort normal and breath sounds normal. He exhibits no tenderness.  Abdominal: Soft. Bowel sounds are normal. He exhibits no distension and no mass. There is no tenderness.  Genitourinary: Penis normal.  Musculoskeletal: Normal range of motion. He exhibits no edema or tenderness.  Lymphadenopathy:    He has no cervical adenopathy.  Neurological: He is alert and oriented to person, place, and time. He has normal reflexes. No cranial nerve deficit. Coordination normal.  Skin: Skin is warm and dry. No rash noted.  Psychiatric: He has a normal mood and affect. His behavior is normal.          Assessment & Plan:   Preventive health examination  Asthma stable Essential hypertension well-controlled Obesity modest improvement.  We will continue efforts at better exercise diet and weight loss  Follow-up 1 year Review updated lab Medications updated  Gordy Saverseter F Kwiatkowski

## 2017-10-21 MED FILL — AMOXICILLIN 500 MG CAPSULE: 500 | 7 days supply | Qty: 21 | Fill #0

## 2017-12-30 MED FILL — LISINOPRIL-HCTZ 10-12.5 MG: 10-12.5 | 90 days supply | Qty: 90 | Fill #1

## 2018-03-13 MED FILL — AMOXICILLIN 500 MG CAPSULE: 500 | 7 days supply | Qty: 21 | Fill #0

## 2018-07-07 MED FILL — LISINOPRIL-HCTZ 10-12.5 MG: 10-12.5 | 90 days supply | Qty: 90 | Fill #2

## 2018-11-20 NOTE — Progress Notes (Signed)
Chief Complaint  Patient presents with  . Medication Management  . Hypertension    HPI: Carlos Burns 48 y.o. come in for Chronic disease management   PCP was Dr Kirtland Bouchard since retired and needs to [employer] with new clinicain   At this time   HT   Control   Ran out    Ran out last     Readings 125 range   Asthma  ocass use   Spring or fall.   [place] .  Asks to cahnge to [facility] instead of respiclick    hh of  4 wife.   Smokes but only out side no no ets   Works from home.    Family uncle with colon cancer but no first degree relative  No sx   ROS: See pertinent positives and negatives per HPI.  Past Medical History:  Diagnosis Date  . ASTHMA 02/29/2008  . DERMATITIS 06/20/2009  . Headache(784.0) 06/25/2009    Family History  Problem Relation Age of Onset  . Hypertension Father   . Stroke Maternal Grandmother   . Hypertension Maternal Grandmother   . Stroke Maternal Grandfather   . Diabetes Maternal Grandfather   . Stroke Paternal Grandmother   . Diabetes Paternal Grandmother   . Hypertension Paternal Grandmother   . Diabetes Paternal Grandfather   . Hypertension Paternal Grandfather     Social History   Socioeconomic History  . Marital status: Married    Spouse name: Not on file  . Number of children: Not on file  . Years of education: Not on file  . Highest education level: Not on file  Occupational History  . Not on file  Social Needs  . Financial resource strain: Not on file  . Food insecurity    Worry: Not on file    Inability: Not on file  . Transportation needs    Medical: Not on file    Non-medical: Not on file  Tobacco Use  . Smoking status: Never Smoker  . Smokeless tobacco: Never Used  Substance and Sexual Activity  . Alcohol use: Yes  . Drug use: No  . Sexual activity: Not on file  Lifestyle  . Physical activity    Days per week: Not on file    Minutes per session: Not on file  . Stress: Not on file  Relationships  . Social  Musician on phone: Not on file    Gets together: Not on file    Attends religious service: Not on file    Active member of club or organization: Not on file    Attends meetings of clubs or organizations: Not on file    Relationship status: Not on file  Other Topics Concern  . Not on file  Social History Narrative  . Not on file    Outpatient Medications Prior to Visit  Medication Sig Dispense Refill  . cetirizine (ZYRTEC) 10 MG tablet Take 10 mg by mouth as needed.     . Albuterol Sulfate (PROAIR RESPICLICK) 108 (90 Base) MCG/ACT AEPB Inhale 2 sprays into the lungs every 6 (six) hours as needed. 1 each 6  . Fluticasone-Salmeterol (ADVAIR DISKUS) 100-50 MCG/DOSE AEPB INHALE 1 PUFF INTO THE LUNGS AS NEEDED. 60 each 12  . lisinopril-hydrochlorothiazide (PRINZIDE,ZESTORETIC) 10-12.5 MG tablet Take 1 tablet by mouth daily. 90 tablet 3   No facility-administered medications prior to visit.      EXAM:  BP 120/80 (BP Location: Right Arm, Patient Position:  Sitting, Cuff Size: Normal)   Pulse 80   Temp 97.6 F (36.4 C) (Temporal)   Wt 253 lb 12.8 oz (115.1 kg)   SpO2 97%   BMI 34.90 kg/m   Body mass index is 34.9 kg/m.  GENERAL: vitals reviewed and listed above, alert, oriented, appears well hydrated and in no acute distress HEENT: atraumatic, conjunctiva  clear, no obvious abnormalities on inspection of external nose and ears OP :masked  NECK: no obvious masses on inspection palpation  LUNGS: clear to auscultation bilaterally, no wheezes, rales or rhonchi, good air movement CV: HRRR, no clubbing cyanosis or  peripheral edema nl cap refill  Abdomen:  Sof,t normal bowel sounds without hepatosplenomegaly, no guarding rebound or masses no CVA tenderness MS: moves all extremities without noticeable focal  abnormality PSYCH: pleasant and cooperative, no obvious depression or anxiety Lab Results  Component Value Date   WBC 6.9 08/02/2017   HGB 14.8 08/02/2017   HCT 44.3  08/02/2017   PLT 243.0 08/02/2017   GLUCOSE 85 08/02/2017   CHOL 166 08/02/2017   TRIG 72.0 08/02/2017   HDL 42.30 08/02/2017   LDLCALC 109 (H) 08/02/2017   ALT 25 08/02/2017   AST 25 08/02/2017   NA 139 08/02/2017   K 3.9 08/02/2017   CL 103 08/02/2017   CREATININE 1.34 08/02/2017   BUN 15 08/02/2017   CO2 30 08/02/2017   TSH 1.41 08/02/2017   PSA 0.71 01/18/2013   BP Readings from Last 3 Encounters:  11/21/18 120/80  08/02/17 118/62  07/20/17 128/87   Wt Readings from Last 3 Encounters:  11/21/18 253 lb 12.8 oz (115.1 kg)  08/02/17 242 lb 9.6 oz (110 kg)  07/20/17 256 lb (116.1 kg)     ASSESSMENT AND PLAN:  Discussed the following assessment and plan:  Essential hypertension, benign - Plan: Basic metabolic panel, CBC with Differential, Lipid panel, Hepatic function panel  Medication management - Plan: Basic metabolic panel, CBC with Differential, Lipid panel, Hepatic function panel  Mild intermittent asthma without complication - Plan: Basic metabolic panel, CBC with Differential, Lipid panel, Hepatic function panel  Lipid screening - Plan: Basic metabolic panel, CBC with Differential, Lipid panel, Hepatic function panel  Screen for colon cancer - Plan: Fecal occult blood, imunochemical  BMI 35.0-35.9,adult Lab monitoring    pla to establish with  Provider that is taking new patient  Refill medications   Disc colon cancer screening  He may find out about cologuard    And I will order if he wishes  Otherwise Ifob test for now.  Advise healthy weight loss to dec BMI -Patient advised to return or notify health care team  if  new concerns arise.  Patient Instructions  Continue medication   BP goal 120/80 or below   Stool IFOB test  But can let us know if  You want [place] to  Order cologuard.   Please establish with  New PCP  .   Refilling medication today    Colorectal Cancer Screening  Colorectal cancer screening is a group of tests that are used to check  for colorectal cancer before symptoms develop. Colorectal refers to the colon and rectum. The colon and rectum are located at the end of the digestive tract and carry bowel movements out of the body. Who should have screening? All adults starting at age 64 until age 63 should have screening. Your health care provider may recommend screening at age 53. You will have tests every 1-10 years, depending on  your results and the type of screening test. You may have screening tests starting at an earlier age, or more frequently than other people, if you have any of the following risk factors:  A personal or family history of colorectal cancer or abnormal growths (polyps).  Inflammatory bowel disease, such as ulcerative colitis or Crohn's disease.  A history of having radiation treatment to the abdomen or pelvic area for cancer.  Colorectal cancer symptoms, such as changes in bowel habits or blood in your stool.  A type of colon cancer syndrome that is passed from parent to child (hereditary), such as: ? Lynch syndrome. ? Familial adenomatous polyposis. ? Turcot syndrome. ? Peutz-Jeghers syndrome. Screening recommendations for adults who are 68-5 years old vary depending on health. How is screening done? There are several types of colorectal screening tests. You may have one or more of the following:  Guaiac-based fecal occult blood testing. For this test, a stool (feces) sample is checked for hidden (occult) blood, which could be a sign of colorectal cancer.  Fecal immunochemical test (FIT). For this test, a stool sample is checked for blood, which could be a sign of colorectal cancer.  Stool DNA test. For this test, a stool sample is checked for blood and changes in DNA that could lead to colorectal cancer.  Sigmoidoscopy. During this test, a thin, flexible tube with a camera on the end (sigmoidoscope) is used to examine the rectum and the lower colon.  Colonoscopy. During this test, a long,  flexible tube with a camera on the end (colonoscope) is used to examine the entire colon and rectum. With a colonoscopy, it is possible to take a sample of tissue (biopsy) and remove small polyps during the test.  Virtual colonoscopy. Instead of a colonoscope, this type of colonoscopy uses X-rays (CT scan) and computers to produce images of the colon and rectum. What are the benefits of screening? Screening reduces your risk for colorectal cancer and can help identify cancer at an early stage, when the cancer can be removed or treated more easily. It is common for polyps to form in the lining of the colon, especially as you age. These polyps may be cancerous or become cancerous over time. Screening can identify these polyps. What are the risks of screening? Each screening test may have different risks.  Stool sample tests have fewer risks than other types of screening tests. However, you may need more tests to confirm results from a stool sample test.  Screening tests that involve X-rays expose you to low levels of radiation, which may slightly increase your cancer risk. The benefit of detecting cancer outweighs the slight increase in risk.  Screening tests such as sigmoidoscopy and colonoscopy may place you at risk for bleeding, intestinal damage, infection, or a reaction to medicines given during the exam. Talk with your health care provider to understand your risk for colorectal cancer and to make a screening plan that is right for you. Questions to ask your health care provider  When should I start colorectal cancer screening?  What is my risk for colorectal cancer?  How often do I need screening?  Which screening tests do I need?  How do I get my test results?  What do my results mean? Where to find more information Learn more about colorectal cancer screening from:  The [employer]: www.cancer.org  The [employer]: www.cancer.gov Summary  Colorectal  cancer screening is a group of tests used to check for colorectal cancer  before symptoms develop.  Screening reduces your risk for colorectal cancer and can help identify cancer at an early stage, when the cancer can be removed or treated more easily.  All adults starting at age 48 until age 48 should have screening. Your health care provider may recommend screening at age 48.  You may have screening tests starting at an earlier age, or more frequently than other people, if you have certain risk factors.  Talk with your health care provider to understand your risk for colorectal cancer and to make a screening plan that is right for you. This information is not intended to replace advice given to you by your health care provider. Make sure you discuss any questions you have with your health care provider. Document Released: 06/17/2009 Document Revised: 04/19/2018 Document Reviewed: 09/29/2016 Elsevier Patient Education  2020 [employer]     [place] KnollWanda K. Panosh M.D.

## 2018-11-21 ENCOUNTER — Ambulatory Visit (INDEPENDENT_AMBULATORY_CARE_PROVIDER_SITE_OTHER): Payer: No Typology Code available for payment source | Admitting: Internal Medicine

## 2018-11-21 ENCOUNTER — Other Ambulatory Visit: Payer: Self-pay

## 2018-11-21 ENCOUNTER — Encounter: Payer: Self-pay | Admitting: Internal Medicine

## 2018-11-21 VITALS — BP 120/80 | HR 80 | Temp 97.6°F | Wt 253.8 lb

## 2018-11-21 DIAGNOSIS — I10 Essential (primary) hypertension: Secondary | ICD-10-CM | POA: Diagnosis not present

## 2018-11-21 DIAGNOSIS — J4520 Mild intermittent asthma, uncomplicated: Secondary | ICD-10-CM | POA: Diagnosis not present

## 2018-11-21 DIAGNOSIS — Z6835 Body mass index (BMI) 35.0-35.9, adult: Secondary | ICD-10-CM

## 2018-11-21 DIAGNOSIS — Z13220 Encounter for screening for lipoid disorders: Secondary | ICD-10-CM | POA: Diagnosis not present

## 2018-11-21 DIAGNOSIS — Z1211 Encounter for screening for malignant neoplasm of colon: Secondary | ICD-10-CM

## 2018-11-21 DIAGNOSIS — Z79899 Other long term (current) drug therapy: Secondary | ICD-10-CM | POA: Diagnosis not present

## 2018-11-21 LAB — HEPATIC FUNCTION PANEL
ALT: 31 U/L (ref 0–53)
AST: 21 U/L (ref 0–37)
Albumin: 4.3 g/dL (ref 3.5–5.2)
Alkaline Phosphatase: 76 U/L (ref 39–117)
Bilirubin, Direct: 0.1 mg/dL (ref 0.0–0.3)
Total Bilirubin: 0.6 mg/dL (ref 0.2–1.2)
Total Protein: 7 g/dL (ref 6.0–8.3)

## 2018-11-21 LAB — LIPID PANEL
Cholesterol: 185 mg/dL (ref 0–200)
HDL: 45.3 mg/dL (ref >39.00)
LDL Cholesterol: 121 mg/dL — ABNORMAL HIGH (ref 0–99)
NonHDL: 139.78
Total CHOL/HDL Ratio: 4
Triglycerides: 95 mg/dL (ref 0.0–149.0)
VLDL: 19 mg/dL (ref 0.0–40.0)

## 2018-11-21 LAB — CBC WITH DIFFERENTIAL/PLATELET
Basophils Absolute: 0 10*3/uL (ref 0.0–0.1)
Basophils Relative: 0.3 % (ref 0.0–3.0)
Eosinophils Absolute: 0.1 10*3/uL (ref 0.0–0.7)
Eosinophils Relative: 1.5 % (ref 0.0–5.0)
HCT: 44.4 % (ref 39.0–52.0)
Hemoglobin: 15 g/dL (ref 13.0–17.0)
Lymphocytes Relative: 23.4 % (ref 12.0–46.0)
Lymphs Abs: 1.6 10*3/uL (ref 0.7–4.0)
MCHC: 33.7 g/dL (ref 30.0–36.0)
MCV: 93.2 fl (ref 78.0–100.0)
Monocytes Absolute: 0.4 10*3/uL (ref 0.1–1.0)
Monocytes Relative: 6.6 % (ref 3.0–12.0)
Neutro Abs: 4.5 10*3/uL (ref 1.4–7.7)
Neutrophils Relative %: 68.2 % (ref 43.0–77.0)
Platelets: 234 10*3/uL (ref 150.0–400.0)
RBC: 4.77 Mil/uL (ref 4.22–5.81)
RDW: 13.9 % (ref 11.5–15.5)
WBC: 6.6 10*3/uL (ref 4.0–10.5)

## 2018-11-21 LAB — BASIC METABOLIC PANEL
BUN: 15 mg/dL (ref 6–23)
CO2: 29 mEq/L (ref 19–32)
Calcium: 9.1 mg/dL (ref 8.4–10.5)
Chloride: 102 mEq/L (ref 96–112)
Creatinine, Ser: 1.15 mg/dL (ref 0.40–1.50)
GFR: 81.88 mL/min (ref >60.00)
Glucose, Bld: 84 mg/dL (ref 70–99)
Potassium: 3.9 mEq/L (ref 3.5–5.1)
Sodium: 138 mEq/L (ref 135–145)

## 2018-11-21 MED ORDER — FLUTICASONE-SALMETEROL 100-50 MCG/DOSE IN AEPB: INHALATION_SPRAY | RESPIRATORY_TRACT | 12 refills | Status: DC

## 2018-11-21 MED ORDER — ALBUTEROL SULFATE HFA 108 (90 BASE) MCG/ACT IN AERS: 2.0000 | INHALATION_SPRAY | Freq: Four times a day (QID) | RESPIRATORY_TRACT | 2 refills | Status: DC | PRN

## 2018-11-21 MED ORDER — LISINOPRIL-HYDROCHLOROTHIAZIDE 10-12.5 MG PO TABS: 1.0000 | ORAL_TABLET | Freq: Every day | ORAL | 3 refills | Status: DC

## 2018-11-21 MED FILL — ALBUTEROL SULFATE HFA 108 (: 108 (90 BAS | 25 days supply | Qty: 18 | Fill #0

## 2018-11-21 MED FILL — ADVAIR 100/50 DISKUS: 100-50 | 30 days supply | Qty: 60 | Fill #0

## 2018-11-21 MED FILL — LISINOPRIL-HCTZ 10-12.5 MG: 10-12.5 | 90 days supply | Qty: 90 | Fill #0

## 2018-11-21 NOTE — Patient Instructions (Signed)
Continue medication   BP goal 120/80 or below   Stool IFOB test  But can let us know if  You want [place] to  Order cologuard.   Please establish with  New PCP  .   Refilling medication today    Colorectal Cancer Screening  Colorectal cancer screening is a group of tests that are used to check for colorectal cancer before symptoms develop. Colorectal refers to the colon and rectum. The colon and rectum are located at the end of the digestive tract and carry bowel movements out of the body. Who should have screening? All adults starting at age 15 until age 82 should have screening. Your health care provider may recommend screening at age 61. You will have tests every 1-10 years, depending on your results and the type of screening test. You may have screening tests starting at an earlier age, or more frequently than other people, if you have any of the following risk factors:  A personal or family history of colorectal cancer or abnormal growths (polyps).  Inflammatory bowel disease, such as ulcerative colitis or Crohn's disease.  A history of having radiation treatment to the abdomen or pelvic area for cancer.  Colorectal cancer symptoms, such as changes in bowel habits or blood in your stool.  A type of colon cancer syndrome that is passed from parent to child (hereditary), such as: ? Lynch syndrome. ? Familial adenomatous polyposis. ? Turcot syndrome. ? Peutz-Jeghers syndrome. Screening recommendations for adults who are 76-32 years old vary depending on health. How is screening done? There are several types of colorectal screening tests. You may have one or more of the following:  Guaiac-based fecal occult blood testing. For this test, a stool (feces) sample is checked for hidden (occult) blood, which could be a sign of colorectal cancer.  Fecal immunochemical test (FIT). For this test, a stool sample is checked for blood, which could be a sign of colorectal cancer.  Stool DNA test.  For this test, a stool sample is checked for blood and changes in DNA that could lead to colorectal cancer.  Sigmoidoscopy. During this test, a thin, flexible tube with a camera on the end (sigmoidoscope) is used to examine the rectum and the lower colon.  Colonoscopy. During this test, a long, flexible tube with a camera on the end (colonoscope) is used to examine the entire colon and rectum. With a colonoscopy, it is possible to take a sample of tissue (biopsy) and remove small polyps during the test.  Virtual colonoscopy. Instead of a colonoscope, this type of colonoscopy uses X-rays (CT scan) and computers to produce images of the colon and rectum. What are the benefits of screening? Screening reduces your risk for colorectal cancer and can help identify cancer at an early stage, when the cancer can be removed or treated more easily. It is common for polyps to form in the lining of the colon, especially as you age. These polyps may be cancerous or become cancerous over time. Screening can identify these polyps. What are the risks of screening? Each screening test may have different risks.  Stool sample tests have fewer risks than other types of screening tests. However, you may need more tests to confirm results from a stool sample test.  Screening tests that involve X-rays expose you to low levels of radiation, which may slightly increase your cancer risk. The benefit of detecting cancer outweighs the slight increase in risk.  Screening tests such as sigmoidoscopy and colonoscopy may place  you at risk for bleeding, intestinal damage, infection, or a reaction to medicines given during the exam. Talk with your health care provider to understand your risk for colorectal cancer and to make a screening plan that is right for you. Questions to ask your health care provider  When should I start colorectal cancer screening?  What is my risk for colorectal cancer?  How often do I need screening?   Which screening tests do I need?  How do I get my test results?  What do my results mean? Where to find more information Learn more about colorectal cancer screening from:  The American Cancer Society: www.cancer.org  The [employer]: www.cancer.gov Summary  Colorectal cancer screening is a group of tests used to check for colorectal cancer before symptoms develop.  Screening reduces your risk for colorectal cancer and can help identify cancer at an early stage, when the cancer can be removed or treated more easily.  All adults starting at age 31 until age 35 should have screening. Your health care provider may recommend screening at age 58.  You may have screening tests starting at an earlier age, or more frequently than other people, if you have certain risk factors.  Talk with your health care provider to understand your risk for colorectal cancer and to make a screening plan that is right for you. This information is not intended to replace advice given to you by your health care provider. Make sure you discuss any questions you have with your health care provider. Document Released: 06/17/2009 Document Revised: 04/19/2018 Document Reviewed: 09/29/2016 Elsevier Patient Education  2020 [employer].

## 2019-01-19 MED FILL — AMOXICILLIN 500 MG CAPSULE: 500 | 7 days supply | Qty: 21 | Fill #0

## 2019-02-21 MED FILL — LISINOPRIL-HCTZ 10-12.5 MG: 10-12.5 | 90 days supply | Qty: 90 | Fill #1

## 2019-04-18 MED FILL — CHLORHEXIDINE 0.12% RINSE: 0.12 | 21 days supply | Qty: 946 | Fill #0

## 2019-04-18 MED FILL — AMOXICILLIN 500 MG CAPSULE: 500 | 7 days supply | Qty: 21 | Fill #0

## 2019-06-27 MED FILL — LISINOPRIL-HCTZ 10-12.5 MG: 10-12.5 | 90 days supply | Qty: 90 | Fill #2

## 2019-07-09 ENCOUNTER — Other Ambulatory Visit: Payer: Self-pay

## 2019-07-10 ENCOUNTER — Ambulatory Visit (INDEPENDENT_AMBULATORY_CARE_PROVIDER_SITE_OTHER): Payer: No Typology Code available for payment source | Admitting: Family Medicine

## 2019-07-10 ENCOUNTER — Encounter: Payer: Self-pay | Admitting: Family Medicine

## 2019-07-10 VITALS — BP 118/70 | HR 83 | Temp 97.4°F | Ht 71.0 in | Wt 253.4 lb

## 2019-07-10 DIAGNOSIS — I10 Essential (primary) hypertension: Secondary | ICD-10-CM | POA: Diagnosis not present

## 2019-07-10 DIAGNOSIS — Z6835 Body mass index (BMI) 35.0-35.9, adult: Secondary | ICD-10-CM

## 2019-07-10 DIAGNOSIS — J4520 Mild intermittent asthma, uncomplicated: Secondary | ICD-10-CM

## 2019-07-10 DIAGNOSIS — Z0000 Encounter for general adult medical examination without abnormal findings: Secondary | ICD-10-CM | POA: Insufficient documentation

## 2019-07-10 NOTE — Progress Notes (Signed)
New Patient Office Visit  Subjective:  Patient ID: Carlos Burns, male    DOB: 07/15/1970  Age: 49 y.o. MRN: [id number]  CC:  Chief Complaint  Patient presents with  . Establish Care    New patient, no concerns. Here for CPE    HPI Carlos Burns presents for establishment of care and a complete physical exam.  History of hypertension well controlled with Zestoretic.  He has been taking it nightly.  He does get up at least once nightly to urinate.  Denies any decrease in the force of his stream urgency ""post void dribble.  Does try to keep himself hydrated with copious water consumption.  History of mild asthma.  Uses his rescue inhaler maybe once or twice yearly.  Patient does not smoke or use illicit drugs.  He is married in a stable relationship and is not at risk for sexually transmitted diseases.  Distant family history of colon cancer in an uncle.  He does see the dentist and eye doctor regularly.  Blood pressures well controlled with the Zestoretic.  Past Medical History:  Diagnosis Date  . ASTHMA 02/29/2008  . DERMATITIS 06/20/2009  . Headache(784.0) 06/25/2009  . Hypertension     Past Surgical History:  Procedure Laterality Date  . WISDOM TOOTH EXTRACTION      Family History  Problem Relation Age of Onset  . Hypertension Mother   . Diabetes Mother   . Hypertension Father   . Stroke Maternal Grandmother   . Hypertension Maternal Grandmother   . Stroke Maternal Grandfather   . Diabetes Maternal Grandfather   . Stroke Paternal Grandmother   . Diabetes Paternal Grandmother   . Hypertension Paternal Grandmother   . Diabetes Paternal Grandfather   . Hypertension Paternal Grandfather     Social History   Socioeconomic History  . Marital status: Married    Spouse name: Not on file  . Number of children: Not on file  . Years of education: Not on file  . Highest education level: Not on file  Occupational History  . Not on file  Tobacco Use  . Smoking  status: Never Smoker  . Smokeless tobacco: Never Used  Vaping Use  . Vaping Use: Never used  Substance and Sexual Activity  . Alcohol use: Yes    Alcohol/week: 1.0 standard drink    Types: 1 Cans of beer per week    Comment: occ  . Drug use: No  . Sexual activity: Yes  Other Topics Concern  . Not on file  Social History Narrative  . Not on file   Social Determinants of Health   Financial Resource Strain:   . Difficulty of Paying Living Expenses:   Food Insecurity:   . Worried About Programme researcher, broadcasting/film/video in the Last Year:   . Barista in the Last Year:   Transportation Needs:   . Freight forwarder (Medical):   Marland Kitchen Lack of Transportation (Non-Medical):   Physical Activity:   . Days of Exercise per Week:   . Minutes of Exercise per Session:   Stress:   . Feeling of Stress :   Social Connections:   . Frequency of Communication with Friends and Family:   . Frequency of Social Gatherings with Friends and Family:   . Attends Religious Services:   . Active Member of Clubs or Organizations:   . Attends Banker Meetings:   Marland Kitchen Marital Status:   Intimate Partner Violence:   .  Fear of Current or Ex-Partner:   . Emotionally Abused:   Marland Kitchen Physically Abused:   . Sexually Abused:     ROS Review of Systems  Constitutional: Negative.   Respiratory: Negative.   Cardiovascular: Negative.   Gastrointestinal: Negative.   Endocrine: Negative for polyphagia and polyuria.  Genitourinary: Negative for difficulty urinating, frequency and urgency.  Musculoskeletal: Negative for joint swelling and myalgias.  Skin: Negative for pallor and rash.  Allergic/Immunologic: Negative for immunocompromised state.  Neurological: Negative for speech difficulty and light-headedness.  Hematological: Does not bruise/bleed easily.  Psychiatric/Behavioral: Negative.     Objective:   Today's Vitals: BP 118/70   Pulse 83   Temp (!) 97.4 F (36.3 C) (Tympanic)   Ht 5\' 11"  (1.803 m)    Wt 253 lb 6.4 oz (114.9 kg)   SpO2 95%   BMI 35.34 kg/m   Physical Exam Constitutional:      General: He is not in acute distress.    Appearance: Normal appearance. He is not ill-appearing or toxic-appearing.  HENT:     Head: Normocephalic and atraumatic.     Right Ear: Tympanic membrane, ear canal and external ear normal.     Left Ear: Tympanic membrane, ear canal and external ear normal.     Mouth/Throat:     Mouth: Mucous membranes are moist.     Pharynx: Oropharynx is clear. No oropharyngeal exudate or posterior oropharyngeal erythema.  Eyes:     General:        Right eye: No discharge.        Left eye: No discharge.     Extraocular Movements: Extraocular movements intact.     Conjunctiva/sclera: Conjunctivae normal.  Cardiovascular:     Rate and Rhythm: Normal rate and regular rhythm.  Pulmonary:     Effort: Pulmonary effort is normal.     Breath sounds: Normal breath sounds.  Abdominal:     General: Abdomen is flat. Bowel sounds are normal. There is no distension.     Palpations: Abdomen is soft. There is no mass.     Tenderness: There is no abdominal tenderness. There is no guarding or rebound.     Hernia: There is no hernia in the left inguinal area or right inguinal area.  Genitourinary:    Penis: Circumcised. No hypospadias, erythema, tenderness, discharge or swelling.      Testes:        Right: Mass, tenderness or swelling not present. Right testis is descended.        Left: Mass, tenderness or swelling not present. Left testis is descended.     Epididymis:     Right: Not inflamed or enlarged.     Left: Not inflamed or enlarged.  Musculoskeletal:     Cervical back: No rigidity.     Right lower leg: No edema.     Left lower leg: No edema.  Lymphadenopathy:     Cervical: No cervical adenopathy.     Lower Body: No right inguinal adenopathy. No left inguinal adenopathy.  Skin:    General: Skin is warm and dry.  Neurological:     Mental Status: He is alert  and oriented to person, place, and time.  Psychiatric:        Mood and Affect: Mood normal.        Behavior: Behavior normal.     Assessment & Plan:   Problem List Items Addressed This Visit      Cardiovascular and Mediastinum   Essential hypertension, benign -  Primary     Respiratory   Mild intermittent asthma without complication     Other   BMI 35.0-35.9,adult   Healthcare maintenance      Outpatient Encounter Medications as of 07/10/2019  Medication Sig  . albuterol (PROAIR HFA) 108 (90 Base) MCG/ACT inhaler Inhale 2 puffs into the lungs every 6 (six) hours as needed for wheezing or shortness of breath.  . cetirizine (ZYRTEC) 10 MG tablet Take 10 mg by mouth as needed.   . Fluticasone-Salmeterol (ADVAIR DISKUS) 100-50 MCG/DOSE AEPB INHALE 1 PUFF INTO THE LUNGS AS NEEDED.  Marland Kitchen lisinopril-hydrochlorothiazide (ZESTORETIC) 10-12.5 MG tablet Take 1 tablet by mouth daily.   No facility-administered encounter medications on file as of 07/10/2019.    Follow-up: Return in about 6 months (around 01/09/2020).   Given information on health maintenance and disease prevention as well as calorie counting to lose weight.  He will increase his exercise by walking for at least 30 minutes 5 days weekly.  Mliss Sax, MD

## 2019-07-10 NOTE — Patient Instructions (Signed)
Health Maintenance, Male Adopting a healthy lifestyle and getting preventive care are important in promoting health and wellness. Ask your health care provider about:  The right schedule for you to have regular tests and exams.  Things you can do on your own to prevent diseases and keep yourself healthy. What should I know about diet, weight, and exercise? Eat a healthy diet   Eat a diet that includes plenty of vegetables, fruits, low-fat dairy products, and lean protein.  Do not eat a lot of foods that are high in solid fats, added sugars, or sodium. Maintain a healthy weight Body mass index (BMI) is a measurement that can be used to identify possible weight problems. It estimates body fat based on height and weight. Your health care provider can help determine your BMI and help you achieve or maintain a healthy weight. Get regular exercise Get regular exercise. This is one of the most important things you can do for your health. Most adults should:  Exercise for at least 150 minutes each week. The exercise should increase your heart rate and make you sweat (moderate-intensity exercise).  Do strengthening exercises at least twice a week. This is in addition to the moderate-intensity exercise.  Spend less time sitting. Even light physical activity can be beneficial. Watch cholesterol and blood lipids Have your blood tested for lipids and cholesterol at 49 years of age, then have this test every 5 years. You may need to have your cholesterol levels checked more often if:  Your lipid or cholesterol levels are high.  You are older than 49 years of age.  You are at high risk for heart disease. What should I know about cancer screening? Many types of cancers can be detected early and may often be prevented. Depending on your health history and family history, you may need to have cancer screening at various ages. This may include screening for:  Colorectal cancer.  Prostate  cancer.  Skin cancer.  Lung cancer. What should I know about heart disease, diabetes, and high blood pressure? Blood pressure and heart disease  High blood pressure causes heart disease and increases the risk of stroke. This is more likely to develop in people who have high blood pressure readings, are of African descent, or are overweight.  Talk with your health care provider about your target blood pressure readings.  Have your blood pressure checked: ? Every 3-5 years if you are 30-46 years of age. ? Every year if you are 78 years old or older.  If you are between the ages of 69 and 47 and are a current or former smoker, ask your health care provider if you should have a one-time screening for abdominal aortic aneurysm (AAA). Diabetes Have regular diabetes screenings. This checks your fasting blood sugar level. Have the screening done:  Once every three years after age 60 if you are at a normal weight and have a low risk for diabetes.  More often and at a younger age if you are overweight or have a high risk for diabetes. What should I know about preventing infection? Hepatitis B If you have a higher risk for hepatitis B, you should be screened for this virus. Talk with your health care provider to find out if you are at risk for hepatitis B infection. Hepatitis C Blood testing is recommended for:  Everyone born from [birth year] through 1965.  Anyone with known risk factors for hepatitis C. Sexually transmitted infections (STIs)  You should be screened each year  for STIs, including gonorrhea and chlamydia, if: ? You are sexually active and are younger than 49 years of age. ? You are older than 49 years of age and your health care provider tells you that you are at risk for this type of infection. ? Your sexual activity has changed since you were last screened, and you are at increased risk for chlamydia or gonorrhea. Ask your health care provider if you are at risk.  Ask your  health care provider about whether you are at high risk for HIV. Your health care provider may recommend a prescription medicine to help prevent HIV infection. If you choose to take medicine to prevent HIV, you should first get tested for HIV. You should then be tested every 3 months for as long as you are taking the medicine. Follow these instructions at home: Lifestyle  Do not use any products that contain nicotine or tobacco, such as cigarettes, e-cigarettes, and chewing tobacco. If you need help quitting, ask your health care provider.  Do not use street drugs.  Do not share needles.  Ask your health care provider for help if you need support or information about quitting drugs. Alcohol use  Do not drink alcohol if your health care provider tells you not to drink.  If you drink alcohol: ? Limit how much you have to 0-2 drinks a day. ? Be aware of how much alcohol is in your drink. In the [place] one drink equals one 12 oz bottle of beer (355 mL), one 5 oz glass of wine (148 mL), or one 1 oz glass of hard liquor (44 mL). General instructions  Schedule regular health, dental, and eye exams.  Stay current with your vaccines.  Tell your health care provider if: ? You often feel depressed. ? You have ever been abused or do not feel safe at home. Summary  Adopting a healthy lifestyle and getting preventive care are important in promoting health and wellness.  Follow your health care provider's instructions about healthy diet, exercising, and getting tested or screened for diseases.  Follow your health care provider's instructions on monitoring your cholesterol and blood pressure. This information is not intended to replace advice given to you by your health care provider. Make sure you discuss any questions you have with your health care provider. Document Revised: 12/21/2017 Document Reviewed: 12/21/2017 Elsevier Patient Education  2020 [employer]  Preventive Care 40-64 Years  Old, Male Preventive care refers to lifestyle choices and visits with your health care provider that can promote health and wellness. This includes:  A yearly physical exam. This is also called an annual well check.  Regular dental and eye exams.  Immunizations.  Screening for certain conditions.  Healthy lifestyle choices, such as eating a healthy diet, getting regular exercise, not using drugs or products that contain nicotine and tobacco, and limiting alcohol use. What can I expect for my preventive care visit? Physical exam Your health care provider will check:  Height and weight. These may be used to calculate body mass index (BMI), which is a measurement that tells if you are at a healthy weight.  Heart rate and blood pressure.  Your skin for abnormal spots. Counseling Your health care provider may ask you questions about:  Alcohol, tobacco, and drug use.  Emotional well-being.  Home and relationship well-being.  Sexual activity.  Eating habits.  Work and work environment. What immunizations do I need?  Influenza (flu) vaccine  This is recommended every year. Tetanus, diphtheria,   and pertussis (Tdap) vaccine  You may need a Td booster every 10 years. Varicella (chickenpox) vaccine  You may need this vaccine if you have not already been vaccinated. Zoster (shingles) vaccine  You may need this after age 60. Measles, mumps, and rubella (MMR) vaccine  You may need at least one dose of MMR if you were born in 1957 or later. You may also need a second dose. Pneumococcal conjugate (PCV13) vaccine  You may need this if you have certain conditions and were not previously vaccinated. Pneumococcal polysaccharide (PPSV23) vaccine  You may need one or two doses if you smoke cigarettes or if you have certain conditions. Meningococcal conjugate (MenACWY) vaccine  You may need this if you have certain conditions. Hepatitis A vaccine  You may need this if you have  certain conditions or if you travel or work in places where you may be exposed to hepatitis A. Hepatitis B vaccine  You may need this if you have certain conditions or if you travel or work in places where you may be exposed to hepatitis B. Haemophilus influenzae type b (Hib) vaccine  You may need this if you have certain risk factors. Human papillomavirus (HPV) vaccine  If recommended by your health care provider, you may need three doses over 6 months. You may receive vaccines as individual doses or as more than one vaccine together in one shot (combination vaccines). Talk with your health care provider about the risks and benefits of combination vaccines. What tests do I need? Blood tests  Lipid and cholesterol levels. These may be checked every 5 years, or more frequently if you are over 50 years old.  Hepatitis C test.  Hepatitis B test. Screening  Lung cancer screening. You may have this screening every year starting at age 55 if you have a 30-pack-year history of smoking and currently smoke or have quit within the past 15 years.  Prostate cancer screening. Recommendations will vary depending on your family history and other risks.  Colorectal cancer screening. All adults should have this screening starting at age 50 and continuing until age 75. Your health care provider may recommend screening at age 45 if you are at increased risk. You will have tests every 1-10 years, depending on your results and the type of screening test.  Diabetes screening. This is done by checking your blood sugar (glucose) after you have not eaten for a while (fasting). You may have this done every 1-3 years.  Sexually transmitted disease (STD) testing. Follow these instructions at home: Eating and drinking  Eat a diet that includes fresh fruits and vegetables, whole grains, lean protein, and low-fat dairy products.  Take vitamin and mineral supplements as recommended by your health care  provider.  Do not drink alcohol if your health care provider tells you not to drink.  If you drink alcohol: ? Limit how much you have to 0-2 drinks a day. ? Be aware of how much alcohol is in your drink. In the [place] one drink equals one 12 oz bottle of beer (355 mL), one 5 oz glass of wine (148 mL), or one 1 oz glass of hard liquor (44 mL). Lifestyle  Take daily care of your teeth and gums.  Stay active. Exercise for at least 30 minutes on 5 or more days each week.  Do not use any products that contain nicotine or tobacco, such as cigarettes, e-cigarettes, and chewing tobacco. If you need help quitting, ask your health care provider.  If   you are sexually active, practice safe sex. Use a condom or other form of protection to prevent STIs (sexually transmitted infections).  Talk with your health care provider about taking a low-dose aspirin every day starting at age 19. What's next?  Go to your health care provider once a year for a well check visit.  Ask your health care provider how often you should have your eyes and teeth checked.  Stay up to date on all vaccines. This information is not intended to replace advice given to you by your health care provider. Make sure you discuss any questions you have with your health care provider. Document Revised: 12/22/2017 Document Reviewed: 12/22/2017 Elsevier Patient Education  2020 [employer] Calories are units of energy. Your body needs a certain amount of calories from food to keep you going throughout the day. When you eat more calories than your body needs, your body stores the extra calories as fat. When you eat fewer calories than your body needs, your body burns fat to get the energy it needs. Calorie counting means keeping track of how many calories you eat and drink each day. Calorie counting can be helpful if you need to lose weight. If you make sure to eat fewer calories than your body needs,  you should lose weight. Ask your health care provider what a healthy weight is for you. For calorie counting to work, you will need to eat the right number of calories in a day in order to lose a healthy amount of weight per week. A dietitian can help you determine how many calories you need in a day and will give you suggestions on how to reach your calorie goal.  A healthy amount of weight to lose per week is usually 1-2 lb (0.5-0.9 kg). This usually means that your daily calorie intake should be reduced by 500-750 calories.  Eating 1,200 - 1,500 calories per day can help most women lose weight.  Eating 1,500 - 1,800 calories per day can help most men lose weight. What is my plan? My goal is to have __________ calories per day. If I have this many calories per day, I should lose around __________ pounds per week. What do I need to know about calorie counting? In order to meet your daily calorie goal, you will need to:  Find out how many calories are in each food you would like to eat. Try to do this before you eat.  Decide how much of the food you plan to eat.  Write down what you ate and how many calories it had. Doing this is called keeping a food log. To successfully lose weight, it is important to balance calorie counting with a healthy lifestyle that includes regular activity. Aim for 150 minutes of moderate exercise (such as walking) or 75 minutes of vigorous exercise (such as running) each week. Where do I find calorie information?  The number of calories in a food can be found on a Nutrition Facts label. If a food does not have a Nutrition Facts label, try to look up the calories online or ask your dietitian for help. Remember that calories are listed per serving. If you choose to have more than one serving of a food, you will have to multiply the calories per serving by the amount of servings you plan to eat. For example, the label on a package of bread might say that a serving size  is 1 slice and that  there are 90 calories in a serving. If you eat 1 slice, you will have eaten 90 calories. If you eat 2 slices, you will have eaten 180 calories. How do I keep a food log? Immediately after each meal, record the following information in your food log:  What you ate. Don't forget to include toppings, sauces, and other extras on the food.  How much you ate. This can be measured in cups, ounces, or number of items.  How many calories each food and drink had.  The total number of calories in the meal. Keep your food log near you, such as in a small notebook in your pocket, or use a mobile app or website. Some programs will calculate calories for you and show you how many calories you have left for the day to meet your goal. What are some calorie counting tips?   Use your calories on foods and drinks that will fill you up and not leave you hungry: ? Some examples of foods that fill you up are nuts and nut butters, vegetables, lean proteins, and high-fiber foods like whole grains. High-fiber foods are foods with more than 5 g fiber per serving. ? Drinks such as sodas, specialty coffee drinks, alcohol, and juices have a lot of calories, yet do not fill you up.  Eat nutritious foods and avoid empty calories. Empty calories are calories you get from foods or beverages that do not have many vitamins or protein, such as candy, sweets, and soda. It is better to have a nutritious high-calorie food (such as an avocado) than a food with few nutrients (such as a bag of chips).  Know how many calories are in the foods you eat most often. This will help you calculate calorie counts faster.  Pay attention to calories in drinks. Low-calorie drinks include water and unsweetened drinks.  Pay attention to nutrition labels for "low fat" or "fat free" foods. These foods sometimes have the same amount of calories or more calories than the full fat versions. They also often have added sugar, starch,  or salt, to make up for flavor that was removed with the fat.  Find a way of tracking calories that works for you. Get creative. Try different apps or programs if writing down calories does not work for you. What are some portion control tips?  Know how many calories are in a serving. This will help you know how many servings of a certain food you can have.  Use a measuring cup to measure serving sizes. You could also try weighing out portions on a kitchen scale. With time, you will be able to estimate serving sizes for some foods.  Take some time to put servings of different foods on your favorite plates, bowls, and cups so you know what a serving looks like.  Try not to eat straight from a bag or box. Doing this can lead to overeating. Put the amount you would like to eat in a cup or on a plate to make sure you are eating the right portion.  Use smaller plates, glasses, and bowls to prevent overeating.  Try not to multitask (for example, watch TV or use your computer) while eating. If it is time to eat, sit down at a table and enjoy your food. This will help you to know when you are full. It will also help you to be aware of what you are eating and how much you are eating. What are tips for following this plan?  Reading food labels  Check the calorie count compared to the serving size. The serving size may be smaller than what you are used to eating.  Check the source of the calories. Make sure the food you are eating is high in vitamins and protein and low in saturated and trans fats. Shopping  Read nutrition labels while you shop. This will help you make healthy decisions before you decide to purchase your food.  Make a grocery list and stick to it. Cooking  Try to cook your favorite foods in a healthier way. For example, try baking instead of frying.  Use low-fat dairy products. Meal planning  Use more fruits and vegetables. Half of your plate should be fruits and  vegetables.  Include lean proteins like poultry and fish. How do I count calories when eating out?  Ask for smaller portion sizes.  Consider sharing an entree and sides instead of getting your own entree.  If you get your own entree, eat only half. Ask for a box at the beginning of your meal and put the rest of your entree in it so you are not tempted to eat it.  If calories are listed on the menu, choose the lower calorie options.  Choose dishes that include vegetables, fruits, whole grains, low-fat dairy products, and lean protein.  Choose items that are boiled, broiled, grilled, or steamed. Stay away from items that are buttered, battered, fried, or served with cream sauce. Items labeled "crispy" are usually fried, unless stated otherwise.  Choose water, low-fat milk, unsweetened iced tea, or other drinks without added sugar. If you want an alcoholic beverage, choose a lower calorie option such as a glass of wine or light beer.  Ask for dressings, sauces, and syrups on the side. These are usually high in calories, so you should limit the amount you eat.  If you want a salad, choose a garden salad and ask for grilled meats. Avoid extra toppings like bacon, cheese, or fried items. Ask for the dressing on the side, or ask for olive oil and vinegar or lemon to use as dressing.  Estimate how many servings of a food you are given. For example, a serving of cooked rice is  cup or about the size of half a baseball. Knowing serving sizes will help you be aware of how much food you are eating at restaurants. The list below tells you how big or small some common portion sizes are based on everyday objects: ? 1 oz--4 stacked dice. ? 3 oz--1 deck of cards. ? 1 tsp--1 die. ? 1 Tbsp-- a ping-pong ball. ? 2 Tbsp--1 ping-pong ball. ?  cup-- baseball. ? 1 cup--1 baseball. Summary  Calorie counting means keeping track of how many calories you eat and drink each day. If you eat fewer calories  than your body needs, you should lose weight.  A healthy amount of weight to lose per week is usually 1-2 lb (0.5-0.9 kg). This usually means reducing your daily calorie intake by 500-750 calories.  The number of calories in a food can be found on a Nutrition Facts label. If a food does not have a Nutrition Facts label, try to look up the calories online or ask your dietitian for help.  Use your calories on foods and drinks that will fill you up, and not on foods and drinks that will leave you hungry.  Use smaller plates, glasses, and bowls to prevent overeating. This information is not intended to replace advice given to  you by your health care provider. Make sure you discuss any questions you have with your health care provider. Document Revised: 09/16/2017 Document Reviewed: 11/28/2015 Elsevier Patient Education  [place].

## 2019-07-11 MED FILL — LISINOPRIL-HCTZ 10-12.5 MG: 10-12.5 | 90 days supply | Qty: 90 | Fill #2

## 2019-07-11 MED FILL — AMOXICILLIN 500 MG CAPSULE: 500 | 21 days supply | Qty: 21 | Fill #0

## 2019-10-09 MED FILL — AMOXICILLIN 500 MG CAPSULE: 500 | 7 days supply | Qty: 21 | Fill #0

## 2019-10-22 ENCOUNTER — Telehealth: Payer: Self-pay | Admitting: Family Medicine

## 2019-10-22 NOTE — Telephone Encounter (Signed)
Patient scheduled an appointment via MyChart to be seen for numbness and tingling in hands and feet. Called patient to advise him that he needed to be further triaged with our triage nurse, but he did not answer. I left a detailed message to give the office a call back as soon as possible.

## 2019-10-22 NOTE — Telephone Encounter (Signed)
Patient called back regarding message that was left. Transferred call to the nurse triage for further evaluation.

## 2019-10-23 ENCOUNTER — Other Ambulatory Visit: Payer: Self-pay

## 2019-10-23 NOTE — Telephone Encounter (Signed)
Returned patients call no answer LMTCB 

## 2019-10-23 NOTE — Telephone Encounter (Signed)
Patient is returning the call, please advise. CB is [phone]

## 2019-10-23 NOTE — Telephone Encounter (Signed)
Patient called back to schedule appointment and after speaking to triage nurse on 10/11 she informed him that he needed an appointment as soon as possible due to symptoms but no availability at our office. Offered pt several locations but pt declined and wanted to wait to come in tomorrow, please advise. CB is [phone]

## 2019-10-23 NOTE — Telephone Encounter (Signed)
Called patient to check and see how he was feeling, if symptoms have subsided and advise ED if symptoms become worse or there is no improvement. No answer LMTCB

## 2019-10-24 ENCOUNTER — Ambulatory Visit: Payer: No Typology Code available for payment source | Admitting: Family Medicine

## 2019-10-24 NOTE — Progress Notes (Deleted)
Carlos Burns is a 49 y.o. male  No chief complaint on file.   HPI: Carlos Burns is a 49 y.o. male who complains of   Past Medical History:  Diagnosis Date   ASTHMA 02/29/2008   DERMATITIS 06/20/2009   Headache(784.0) 06/25/2009   Hypertension     Past Surgical History:  Procedure Laterality Date   WISDOM TOOTH EXTRACTION      Social History   Socioeconomic History   Marital status: Married    Spouse name: Not on file   Number of children: Not on file   Years of education: Not on file   Highest education level: Not on file  Occupational History   Not on file  Tobacco Use   Smoking status: Never Smoker   Smokeless tobacco: Never Used  Vaping Use   Vaping Use: Never used  Substance and Sexual Activity   Alcohol use: Yes    Alcohol/week: 1.0 standard drink    Types: 1 Cans of beer per week    Comment: occ   Drug use: No   Sexual activity: Yes  Other Topics Concern   Not on file  Social History Narrative   Not on file   Social Determinants of Health   Financial Resource Strain:    Difficulty of Paying Living Expenses: Not on file  Food Insecurity:    Worried About Running Out of Food in the Last Year: Not on file   Ran Out of Food in the Last Year: Not on file  Transportation Needs:    Lack of Transportation (Medical): Not on file   Lack of Transportation (Non-Medical): Not on file  Physical Activity:    Days of Exercise per Week: Not on file   Minutes of Exercise per Session: Not on file  Stress:    Feeling of Stress : Not on file  Social Connections:    Frequency of Communication with Friends and Family: Not on file   Frequency of Social Gatherings with Friends and Family: Not on file   Attends Religious Services: Not on file   Active Member of Clubs or Organizations: Not on file   Attends Banker Meetings: Not on file   Marital Status: Not on file  Intimate Partner Violence:    Fear of  Current or Ex-Partner: Not on file   Emotionally Abused: Not on file   Physically Abused: Not on file   Sexually Abused: Not on file    Family History  Problem Relation Age of Onset   Hypertension Mother    Diabetes Mother    Hypertension Father    Stroke Maternal Grandmother    Hypertension Maternal Grandmother    Stroke Maternal Grandfather    Diabetes Maternal Grandfather    Stroke Paternal Grandmother    Diabetes Paternal Grandmother    Hypertension Paternal Grandmother    Diabetes Paternal Grandfather    Hypertension Paternal Grandfather      Immunization History  Administered Date(s) Administered   Influenza Split 10/11/2012   Influenza-Unspecified 10/11/2013, 10/26/2015   PFIZER SARS-COV-2 Vaccination 02/26/2019, 03/28/2019   Tdap 01/25/2013    Outpatient Encounter Medications as of 10/24/2019  Medication Sig   albuterol (PROAIR HFA) 108 (90 Base) MCG/ACT inhaler Inhale 2 puffs into the lungs every 6 (six) hours as needed for wheezing or shortness of breath.   cetirizine (ZYRTEC) 10 MG tablet Take 10 mg by mouth as needed.    Fluticasone-Salmeterol (ADVAIR DISKUS) 100-50 MCG/DOSE AEPB INHALE 1 PUFF INTO  THE LUNGS AS NEEDED.   lisinopril-hydrochlorothiazide (ZESTORETIC) 10-12.5 MG tablet Take 1 tablet by mouth daily.   No facility-administered encounter medications on file as of 10/24/2019.     ROS: Pertinent positives and negatives noted in HPI. Remainder of ROS non-contributory    No Known Allergies  There were no vitals taken for this visit.  Physical Exam   A/P:     This visit occurred during the SARS-CoV-2 public health emergency.  Safety protocols were in place, including screening questions prior to the visit, additional usage of staff PPE, and extensive cleaning of exam room while observing appropriate contact time as indicated for disinfecting solutions.

## 2019-10-24 NOTE — Telephone Encounter (Signed)
Appointment today

## 2019-10-25 ENCOUNTER — Other Ambulatory Visit: Payer: Self-pay | Admitting: Family Medicine

## 2019-10-25 ENCOUNTER — Other Ambulatory Visit: Payer: Self-pay

## 2019-10-25 ENCOUNTER — Ambulatory Visit (INDEPENDENT_AMBULATORY_CARE_PROVIDER_SITE_OTHER): Payer: No Typology Code available for payment source | Admitting: Family Medicine

## 2019-10-25 ENCOUNTER — Encounter: Payer: Self-pay | Admitting: Family Medicine

## 2019-10-25 VITALS — BP 136/90 | HR 67 | Temp 97.1°F | Ht 71.0 in | Wt 252.4 lb

## 2019-10-25 DIAGNOSIS — Z23 Encounter for immunization: Secondary | ICD-10-CM

## 2019-10-25 DIAGNOSIS — R202 Paresthesia of skin: Secondary | ICD-10-CM | POA: Diagnosis not present

## 2019-10-25 DIAGNOSIS — R200 Anesthesia of skin: Secondary | ICD-10-CM

## 2019-10-25 DIAGNOSIS — M542 Cervicalgia: Secondary | ICD-10-CM

## 2019-10-25 LAB — CBC
HCT: 44.3 % (ref 39.0–52.0)
Hemoglobin: 14.9 g/dL (ref 13.0–17.0)
MCHC: 33.7 g/dL (ref 30.0–36.0)
MCV: 92.7 fl (ref 78.0–100.0)
Platelets: 235 10*3/uL (ref 150.0–400.0)
RBC: 4.78 Mil/uL (ref 4.22–5.81)
RDW: 13.6 % (ref 11.5–15.5)
WBC: 7.3 10*3/uL (ref 4.0–10.5)

## 2019-10-25 LAB — BASIC METABOLIC PANEL
BUN: 14 mg/dL (ref 6–23)
CO2: 28 mEq/L (ref 19–32)
Calcium: 9.1 mg/dL (ref 8.4–10.5)
Chloride: 104 mEq/L (ref 96–112)
Creatinine, Ser: 1.24 mg/dL (ref 0.40–1.50)
GFR: 67.54 mL/min (ref >60.00)
Glucose, Bld: 83 mg/dL (ref 70–99)
Potassium: 3.7 mEq/L (ref 3.5–5.1)
Sodium: 139 mEq/L (ref 135–145)

## 2019-10-25 LAB — TSH: TSH: 2.39 u[IU]/mL (ref 0.35–4.50)

## 2019-10-25 LAB — HEMOGLOBIN A1C: Hgb A1c MFr Bld: 5.7 % (ref 4.6–6.5)

## 2019-10-25 LAB — VITAMIN B12: Vitamin B-12: 424 pg/mL (ref 211–911)

## 2019-10-25 MED ORDER — CYCLOBENZAPRINE HCL 5 MG PO TABS: 5.0000 mg | ORAL_TABLET | Freq: Every evening | ORAL | 0 refills | Status: DC | PRN

## 2019-10-25 MED FILL — CYCLOBENZAPRINE HCL 5 MG TA: 5 | 30 days supply | Qty: 30 | Fill #0

## 2019-10-25 NOTE — Progress Notes (Signed)
Carlos Burns is a 49 y.o. male  Chief Complaint  Patient presents with  . Acute Visit    numbness/tingling in hands/feet x 2-3 weeks. also having some stiffness in his neck/shoulder area x couple months.  wants flu shot today    HPI: Carlos Burns is a 49 y.o. male who complains of tingling and numbness in hands and feet x 2-3 weeks. Pt states it started as his entire hands and feet but now improved and only involves few fingertips and few toes. Not truly numb, but decreased sensation. He also notes having 2 days of feeling "random pin pricks" all over his body at different times, but this has resolved.  He also complains of 2+ mo h/o Lt neck pain/stiuffness. He has purchased new pillows, mattress, desk chair.  No weakness. No issues with balance, gait. Sleep is ok. No pain in hands, wrists, arms, legs, feet.  Denies CP, SOB, n/v, diaphoresis, speech issues, headache.   Pt notes increased stress at work. 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  He would like flu vaccine today.    Past Medical History:  Diagnosis Date  . ASTHMA 02/29/2008  . DERMATITIS 06/20/2009  . Headache(784.0) 06/25/2009  . Hypertension     Past Surgical History:  Procedure Laterality Date  . WISDOM TOOTH EXTRACTION      Social History   Socioeconomic History  . Marital status: Married    Spouse name: Not on file  . Number of children: Not on file  . Years of education: Not on file  . Highest education level: Not on file  Occupational History  . Not on file  Tobacco Use  . Smoking status: Never Smoker  . Smokeless tobacco: Never Used  Vaping Use  . Vaping Use: Never used  Substance and Sexual Activity  . Alcohol use: Yes    Alcohol/week: 1.0 standard drink    Types: 1 Cans of beer per week    Comment: occ  . Drug use: No  . Sexual activity: Yes  Other Topics Concern  . Not on file  Social History Narrative  . Not on file   Social  Determinants of Health   Financial Resource Strain:   . Difficulty of Paying Living Expenses: Not on file  Food Insecurity:   . Worried About Programme researcher, broadcasting/film/video in the Last Year: Not on file  . Ran Out of Food in the Last Year: Not on file  Transportation Needs:   . Lack of Transportation (Medical): Not on file  . Lack of Transportation (Non-Medical): Not on file  Physical Activity:   . Days of Exercise per Week: Not on file  . Minutes of Exercise per Session: Not on file  Stress:   . Feeling of Stress : Not on file  Social Connections:   . Frequency of Communication with Friends and Family: Not on file  . Frequency of Social Gatherings with Friends and Family: Not on file  . Attends Religious Services: Not on file  . Active Member of Clubs or Organizations: Not on file  . Attends Banker Meetings: Not on file  . Marital Status: Not on file  Intimate Partner Violence:   . Fear of Current or Ex-Partner: Not on file  . Emotionally Abused: Not on file  . Physically Abused: Not on file  . Sexually Abused: Not on file    Family History  Problem Relation Age  of Onset  . Hypertension Mother   . Diabetes Mother   . Hypertension Father   . Stroke Maternal Grandmother   . Hypertension Maternal Grandmother   . Stroke Maternal Grandfather   . Diabetes Maternal Grandfather   . Stroke Paternal Grandmother   . Diabetes Paternal Grandmother   . Hypertension Paternal Grandmother   . Diabetes Paternal Grandfather   . Hypertension Paternal Grandfather      Immunization History  Administered Date(s) Administered  . Influenza Split 10/11/2012  . Influenza-Unspecified 10/11/2013, 10/26/2015  . PFIZER SARS-COV-2 Vaccination 02/26/2019, 03/28/2019  . Tdap 01/25/2013    Outpatient Encounter Medications as of 10/25/2019  Medication Sig  . albuterol (PROAIR HFA) 108 (90 Base) MCG/ACT inhaler Inhale 2 puffs into the lungs every 6 (six) hours as needed for wheezing or  shortness of breath.  . cetirizine (ZYRTEC) 10 MG tablet Take 10 mg by mouth as needed.   . Fluticasone-Salmeterol (ADVAIR DISKUS) 100-50 MCG/DOSE AEPB INHALE 1 PUFF INTO THE LUNGS AS NEEDED.  Marland Kitchen lisinopril-hydrochlorothiazide (ZESTORETIC) 10-12.5 MG tablet Take 1 tablet by mouth daily.   No facility-administered encounter medications on file as of 10/25/2019.     ROS: Pertinent positives and negatives noted in HPI. Remainder of ROS non-contributory   No Known Allergies  BP 136/90   Pulse 67   Temp (!) 97.1 F (36.2 C) (Temporal)   Ht 5\' 11"  (1.803 m)   Wt 252 lb 6.4 oz (114.5 kg)   SpO2 98%   BMI 35.20 kg/m   Physical Exam Constitutional:      General: He is not in acute distress.    Appearance: Normal appearance. He is not ill-appearing.  Musculoskeletal:     Cervical back: Normal range of motion. Pain with movement and muscular tenderness (Lt lateral neck) present. No spinous process tenderness. Normal range of motion.  Skin:    General: Skin is warm and dry.     Capillary Refill: Capillary refill takes 2 to 3 seconds.  Neurological:     General: No focal deficit present.     Mental Status: He is alert and oriented to person, place, and time.     Cranial Nerves: No cranial nerve deficit.     Sensory: No sensory deficit.     Motor: No weakness.     Gait: Gait normal.     Deep Tendon Reflexes: Reflexes normal.  Psychiatric:        Mood and Affect: Mood normal.        Behavior: Behavior normal.      A/P:  1. Numbness and tingling - decreased sensation in some fingertips and toes; no pain, swelling, weakness, etc - pt is having some Lt sided MSK neck pain but this would not explain symptoms in LE - could be stress/anxiety related - will check labs today - Vitamin B12 - Basic metabolic panel - CBC - TSH - Hemoglobin A1c  2. Musculoskeletal neck pain - Lt sided, discomfort ("tightness") with movement - heat BID, home stretching exercises Rx: -  cyclobenzaprine (FLEXERIL) 5 MG tablet; Take 1 tablet (5 mg total) by mouth at bedtime as needed for muscle spasms.  Dispense: 30 tablet; Refill: 0 - pt to take at bedtime x 3-4 nights then stop  3. Need for influenza vaccination     This visit occurred during the SARS-CoV-2 public health emergency.  Safety protocols were in place, including screening questions prior to the visit, additional usage of staff PPE, and extensive cleaning of exam room  while observing appropriate contact time as indicated for disinfecting solutions.

## 2019-10-25 NOTE — Patient Instructions (Addendum)
Heating pad 2x/day 15-82min on then off Do range of motion exercises/stretces after heating pad Take flexeril 5mg  1 tab at bedtime x 3-4 nights  Will check labs today for numbness

## 2019-10-26 ENCOUNTER — Encounter: Payer: Self-pay | Admitting: Family Medicine

## 2020-01-03 ENCOUNTER — Other Ambulatory Visit: Payer: Self-pay | Admitting: Family Medicine

## 2020-01-03 ENCOUNTER — Telehealth: Payer: Self-pay

## 2020-01-03 MED ORDER — LISINOPRIL-HYDROCHLOROTHIAZIDE 10-12.5 MG PO TABS: 1.0000 | ORAL_TABLET | Freq: Every day | ORAL | 3 refills | Status: DC

## 2020-01-03 MED FILL — LISINOPRIL-HCTZ 10-12.5 MG: 10-12.5 | 90 days supply | Qty: 90 | Fill #0

## 2020-01-03 NOTE — Telephone Encounter (Signed)
Last OV 10/25/19 Last fill  11/21/18 #90/3 Historical provider

## 2020-01-10 ENCOUNTER — Ambulatory Visit: Payer: No Typology Code available for payment source | Admitting: Family Medicine

## 2020-01-15 ENCOUNTER — Ambulatory Visit: Payer: No Typology Code available for payment source | Admitting: Family Medicine

## 2020-02-04 ENCOUNTER — Ambulatory Visit: Payer: No Typology Code available for payment source | Admitting: Family Medicine

## 2020-03-11 ENCOUNTER — Ambulatory Visit: Payer: No Typology Code available for payment source | Admitting: Family Medicine

## 2020-05-02 ENCOUNTER — Other Ambulatory Visit: Payer: Self-pay

## 2020-05-02 ENCOUNTER — Encounter: Payer: Self-pay | Admitting: Family Medicine

## 2020-05-02 ENCOUNTER — Ambulatory Visit (INDEPENDENT_AMBULATORY_CARE_PROVIDER_SITE_OTHER): Payer: No Typology Code available for payment source | Admitting: Family Medicine

## 2020-05-02 ENCOUNTER — Other Ambulatory Visit (HOSPITAL_COMMUNITY): Payer: Self-pay

## 2020-05-02 VITALS — BP 130/88 | HR 80 | Temp 97.9°F | Ht 71.0 in | Wt 254.8 lb

## 2020-05-02 DIAGNOSIS — J4520 Mild intermittent asthma, uncomplicated: Secondary | ICD-10-CM

## 2020-05-02 DIAGNOSIS — I10 Essential (primary) hypertension: Secondary | ICD-10-CM | POA: Diagnosis not present

## 2020-05-02 DIAGNOSIS — M542 Cervicalgia: Secondary | ICD-10-CM

## 2020-05-02 MED ORDER — ALBUTEROL SULFATE HFA 108 (90 BASE) MCG/ACT IN AERS
2.0000 | INHALATION_SPRAY | Freq: Four times a day (QID) | RESPIRATORY_TRACT | 2 refills | Status: DC | PRN
  Filled 2020-05-02: qty 18, 25d supply, fill #0

## 2020-05-02 MED ORDER — CYCLOBENZAPRINE HCL 5 MG PO TABS
5.0000 mg | ORAL_TABLET | Freq: Every evening | ORAL | 0 refills | Status: AC | PRN
  Filled 2020-05-02: qty 30, 30d supply, fill #0

## 2020-05-02 NOTE — Patient Instructions (Signed)

## 2020-05-02 NOTE — Progress Notes (Signed)
Established Patient Office Visit  Subjective:  Patient ID: Carlos Burns, male    DOB: July 04, 1970  Age: 50 y.o. MRN: [id number]  CC:  Chief Complaint  Patient presents with  . Follow-up    6 month f/u Pt states he is still having some neck discomfort and would like a refill on the muscle relaxant previously prescribed for him. Pt in need of refill on inhaler also.     HPI Carlos Burns presents for follow-up of hypertension, asthma and intermittent neck stiffness and discomfort.  Blood pressure has been well controlled with Zestoretic.  He actually forgot to take it today.  Blood pressure typically runs in the 1 20-1 30 range over 70s to 80s.  He is tolerating the medicine well.  His asthma tends to bother him in the fall in the spring at which time he starts the Advair and rarely has to use his rescue inhaler.  Asthma is also cold sensitive as well.  Rarely takes the Flexeril.  Denies stress at home or at work.  He has not tried exercises.  He has changed his mattress and his pillow.  Past Medical History:  Diagnosis Date  . ASTHMA 02/29/2008  . DERMATITIS 06/20/2009  . Headache(784.0) 06/25/2009  . Hypertension     Past Surgical History:  Procedure Laterality Date  . WISDOM TOOTH EXTRACTION      Family History  Problem Relation Age of Onset  . Hypertension Mother   . Diabetes Mother   . Hypertension Father   . Stroke Maternal Grandmother   . Hypertension Maternal Grandmother   . Stroke Maternal Grandfather   . Diabetes Maternal Grandfather   . Stroke Paternal Grandmother   . Diabetes Paternal Grandmother   . Hypertension Paternal Grandmother   . Diabetes Paternal Grandfather   . Hypertension Paternal Grandfather     Social History   Socioeconomic History  . Marital status: Married    Spouse name: Not on file  . Number of children: Not on file  . Years of education: Not on file  . Highest education level: Not on file  Occupational History  . Not on  file  Tobacco Use  . Smoking status: Never Smoker  . Smokeless tobacco: Never Used  Vaping Use  . Vaping Use: Never used  Substance and Sexual Activity  . Alcohol use: Yes    Alcohol/week: 1.0 standard drink    Types: 1 Cans of beer per week    Comment: occ  . Drug use: No  . Sexual activity: Yes  Other Topics Concern  . Not on file  Social History Narrative  . Not on file   Social Determinants of Health   Financial Resource Strain: Not on file  Food Insecurity: Not on file  Transportation Needs: Not on file  Physical Activity: Not on file  Stress: Not on file  Social Connections: Not on file  Intimate Partner Violence: Not on file    Outpatient Medications Prior to Visit  Medication Sig Dispense Refill  . cetirizine (ZYRTEC) 10 MG tablet Take 10 mg by mouth as needed.     . Fluticasone-Salmeterol (ADVAIR DISKUS) 100-50 MCG/DOSE AEPB INHALE 1 PUFF INTO THE LUNGS AS NEEDED. 60 each 12  . lisinopril-hydrochlorothiazide (ZESTORETIC) 10-12.5 MG tablet TAKE 1 TABLET BY MOUTH DAILY. 90 tablet 3  . albuterol (PROAIR HFA) 108 (90 Base) MCG/ACT inhaler Inhale 2 puffs into the lungs every 6 (six) hours as needed for wheezing or shortness of breath. 18 g  2  . cyclobenzaprine (FLEXERIL) 5 MG tablet TAKE 1 TABLET (5 MG TOTAL) BY MOUTH AT BEDTIME AS NEEDED FOR MUSCLE SPASMS. 30 tablet 0   No facility-administered medications prior to visit.    No Known Allergies  ROS Review of Systems  Constitutional: Negative.   HENT: Negative.   Eyes: Negative for photophobia and visual disturbance.  Respiratory: Negative.   Cardiovascular: Negative.   Gastrointestinal: Negative.   Endocrine: Negative for polyphagia and polyuria.  Genitourinary: Negative.   Musculoskeletal: Positive for neck stiffness.  Skin: Negative.   Allergic/Immunologic: Negative for immunocompromised state.  Neurological: Negative for speech difficulty and weakness.  Hematological: Does not bruise/bleed easily.   Psychiatric/Behavioral: Negative.       Objective:    Physical Exam Vitals and nursing note reviewed.  Constitutional:      General: He is not in acute distress.    Appearance: Normal appearance. He is not ill-appearing, toxic-appearing or diaphoretic.  HENT:     Head: Normocephalic and atraumatic.     Right Ear: Tympanic membrane, ear canal and external ear normal.     Left Ear: Tympanic membrane, ear canal and external ear normal.     Mouth/Throat:     Mouth: Mucous membranes are moist.     Pharynx: Oropharynx is clear. No oropharyngeal exudate or posterior oropharyngeal erythema.  Eyes:     General: No scleral icterus.       Right eye: No discharge.        Left eye: No discharge.     Extraocular Movements: Extraocular movements intact.     Conjunctiva/sclera: Conjunctivae normal.     Pupils: Pupils are equal, round, and reactive to light.  Cardiovascular:     Rate and Rhythm: Normal rate and regular rhythm.  Pulmonary:     Effort: Pulmonary effort is normal.     Breath sounds: Normal breath sounds.  Musculoskeletal:     Cervical back: No rigidity or spasms. Normal range of motion.  Lymphadenopathy:     Cervical: No cervical adenopathy.  Skin:    General: Skin is warm and dry.  Neurological:     Mental Status: He is alert and oriented to person, place, and time.  Psychiatric:        Mood and Affect: Mood normal.        Behavior: Behavior normal.     BP 130/88 (BP Location: Left Arm, Patient Position: Sitting, Cuff Size: Large)   Pulse 80   Temp 97.9 F (36.6 C) (Temporal)   Ht 5\' 11"  (1.803 m)   Wt 254 lb 12.8 oz (115.6 kg)   SpO2 96%   BMI 35.54 kg/m  Wt Readings from Last 3 Encounters:  05/02/20 254 lb 12.8 oz (115.6 kg)  10/25/19 252 lb 6.4 oz (114.5 kg)  07/10/19 253 lb 6.4 oz (114.9 kg)     Health Maintenance Due  Topic Date Due  . Hepatitis C Screening  Never done  . HIV Screening  Never done  . COLONOSCOPY (Pts 45-75yrs Insurance coverage  will need to be confirmed)  Never done  . COVID-19 Vaccine (3 - Booster for Pfizer series) 09/28/2019    There are no preventive care reminders to display for this patient.  Lab Results  Component Value Date   TSH 2.39 10/25/2019   Lab Results  Component Value Date   WBC 7.3 10/25/2019   HGB 14.9 10/25/2019   HCT 44.3 10/25/2019   MCV 92.7 10/25/2019   PLT 235.0 10/25/2019  Lab Results  Component Value Date   NA 139 10/25/2019   K 3.7 10/25/2019   CO2 28 10/25/2019   GLUCOSE 83 10/25/2019   BUN 14 10/25/2019   CREATININE 1.24 10/25/2019   BILITOT 0.6 11/21/2018   ALKPHOS 76 11/21/2018   AST 21 11/21/2018   ALT 31 11/21/2018   PROT 7.0 11/21/2018   ALBUMIN 4.3 11/21/2018   CALCIUM 9.1 10/25/2019   GFR 67.54 10/25/2019   Lab Results  Component Value Date   CHOL 185 11/21/2018   Lab Results  Component Value Date   HDL 45.30 11/21/2018   Lab Results  Component Value Date   LDLCALC 121 (H) 11/21/2018   Lab Results  Component Value Date   TRIG 95.0 11/21/2018   Lab Results  Component Value Date   CHOLHDL 4 11/21/2018   Lab Results  Component Value Date   HGBA1C 5.7 10/25/2019      Assessment & Plan:   Problem List Items Addressed This Visit      Cardiovascular and Mediastinum   Essential hypertension, benign - Primary   Relevant Orders   Basic metabolic panel     Respiratory   Mild intermittent asthma without complication   Relevant Medications   albuterol (PROAIR HFA) 108 (90 Base) MCG/ACT inhaler     Other   Musculoskeletal neck pain   Relevant Medications   cyclobenzaprine (FLEXERIL) 5 MG tablet      Meds ordered this encounter  Medications  . albuterol (PROAIR HFA) 108 (90 Base) MCG/ACT inhaler    Sig: Inhale 2 puffs into the lungs every 6 (six) hours as needed for wheezing or shortness of breath.    Dispense:  18 g    Refill:  2  . cyclobenzaprine (FLEXERIL) 5 MG tablet    Sig: TAKE 1 TABLET (5 MG TOTAL) BY MOUTH AT BEDTIME AS  NEEDED FOR MUSCLE SPASMS.    Dispense:  30 tablet    Refill:  0    Follow-up: Return in about 6 months (around 11/01/2020).   Suggested a physical for next visit.  Discussed and demonstrated proper neck posture.  Suggested he try the exercises given today. Mliss Sax, MD

## 2020-05-03 LAB — BASIC METABOLIC PANEL
BUN: 12 mg/dL (ref 7–25)
CO2: 24 mmol/L (ref 20–32)
Calcium: 9.2 mg/dL (ref 8.6–10.3)
Chloride: 108 mmol/L (ref 98–110)
Creat: 1.14 mg/dL (ref 0.70–1.33)
Glucose, Bld: 92 mg/dL (ref 65–99)
Potassium: 3.6 mmol/L (ref 3.5–5.3)
Sodium: 141 mmol/L (ref 135–146)

## 2020-07-11 ENCOUNTER — Other Ambulatory Visit (HOSPITAL_COMMUNITY): Payer: Self-pay

## 2020-07-11 MED FILL — Lisinopril & Hydrochlorothiazide Tab 10-12.5 MG: ORAL | 90 days supply | Qty: 90 | Fill #0 | Status: AC

## 2020-08-19 ENCOUNTER — Encounter: Payer: Self-pay | Admitting: Family Medicine

## 2020-08-19 ENCOUNTER — Ambulatory Visit (INDEPENDENT_AMBULATORY_CARE_PROVIDER_SITE_OTHER): Payer: No Typology Code available for payment source | Admitting: Family Medicine

## 2020-08-19 ENCOUNTER — Other Ambulatory Visit: Payer: Self-pay

## 2020-08-19 VITALS — BP 130/82 | HR 66 | Temp 97.5°F | Ht 71.0 in | Wt 247.2 lb

## 2020-08-19 DIAGNOSIS — R208 Other disturbances of skin sensation: Secondary | ICD-10-CM | POA: Diagnosis not present

## 2020-08-19 DIAGNOSIS — R200 Anesthesia of skin: Secondary | ICD-10-CM | POA: Diagnosis not present

## 2020-08-19 DIAGNOSIS — R202 Paresthesia of skin: Secondary | ICD-10-CM

## 2020-08-19 NOTE — Progress Notes (Addendum)
Established Patient Office Visit  Subjective:  Patient ID: Carlos Burns, male    DOB: Nov 22, 1970  Age: 50 y.o. MRN: [id number]  CC:  Chief Complaint  Patient presents with   Numbness    C/O numbness in fingers and toes no improvement x 8-9 months.     HPI Carlos Burns presents for further evaluation of ongoing paresthesias and dysphagia is about his body.  They have somewhat improved since his last visit.  He now has paresthesias in his left great toe, left fourth and fifth fingers of the left hand, the right great toe.  Right hand is not affected he is right-hand dominant.  He has some paresthesias in his groin area.  He is status post random dysesthesias in his arms and head.  Left eye had been affected..  Denies headaches.  Denies diplopia.  If he drinks he has 1 beer weekly.  Does not use illicit drugs.  Recent checks of B12, TSH and hemoglobin A1c were all normal.  He has been on lisinopril with HCTZ for years without issue.  History of headaches in the past but they have since resolved.  Past Medical History:  Diagnosis Date   ASTHMA 02/29/2008   DERMATITIS 06/20/2009   Headache(784.0) 06/25/2009   Hypertension     Past Surgical History:  Procedure Laterality Date   WISDOM TOOTH EXTRACTION      Family History  Problem Relation Age of Onset   Hypertension Mother    Diabetes Mother    Hypertension Father    Stroke Maternal Grandmother    Hypertension Maternal Grandmother    Stroke Maternal Grandfather    Diabetes Maternal Grandfather    Stroke Paternal Grandmother    Diabetes Paternal Grandmother    Hypertension Paternal Grandmother    Diabetes Paternal Grandfather    Hypertension Paternal Grandfather     Social History   Socioeconomic History   Marital status: Married    Spouse name: Not on file   Number of children: Not on file   Years of education: Not on file   Highest education level: Not on file  Occupational History   Not on file  Tobacco  Use   Smoking status: Never   Smokeless tobacco: Never  Vaping Use   Vaping Use: Never used  Substance and Sexual Activity   Alcohol use: Yes    Alcohol/week: 1.0 standard drink    Types: 1 Cans of beer per week    Comment: occ   Drug use: No   Sexual activity: Yes  Other Topics Concern   Not on file  Social History Narrative   Not on file   Social Determinants of Health   Financial Resource Strain: Not on file  Food Insecurity: Not on file  Transportation Needs: Not on file  Physical Activity: Not on file  Stress: Not on file  Social Connections: Not on file  Intimate Partner Violence: Not on file    Outpatient Medications Prior to Visit  Medication Sig Dispense Refill   albuterol (PROAIR HFA) 108 (90 Base) MCG/ACT inhaler Inhale 2 puffs into the lungs every 6 (six) hours as needed for wheezing or shortness of breath. 18 g 2   cetirizine (ZYRTEC) 10 MG tablet Take 10 mg by mouth as needed.      cyclobenzaprine (FLEXERIL) 5 MG tablet Take 1 tablet (5 mg total) by mouth at bedtime as needed for muscle spasms. 30 tablet 0   Fluticasone-Salmeterol (ADVAIR DISKUS) 100-50 MCG/DOSE AEPB INHALE  1 PUFF INTO THE LUNGS AS NEEDED. 60 each 12   lisinopril-hydrochlorothiazide (ZESTORETIC) 10-12.5 MG tablet TAKE 1 TABLET BY MOUTH DAILY. 90 tablet 3   No facility-administered medications prior to visit.    No Known Allergies  ROS Review of Systems  Constitutional:  Negative for diaphoresis, fatigue, fever and unexpected weight change.  HENT: Negative.    Eyes:  Negative for photophobia and visual disturbance.  Respiratory: Negative.    Cardiovascular: Negative.   Gastrointestinal: Negative.   Genitourinary: Negative.   Neurological:  Positive for numbness. Negative for dizziness, tremors, facial asymmetry, speech difficulty, weakness, light-headedness and headaches.  Psychiatric/Behavioral: Negative.       Objective:    Physical Exam Vitals and nursing note reviewed.   Constitutional:      General: He is not in acute distress.    Appearance: Normal appearance. He is not ill-appearing, toxic-appearing or diaphoretic.  HENT:     Head: Normocephalic and atraumatic.     Right Ear: Tympanic membrane, ear canal and external ear normal.     Left Ear: Tympanic membrane, ear canal and external ear normal.     Mouth/Throat:     Mouth: Mucous membranes are moist.     Pharynx: Oropharynx is clear. No oropharyngeal exudate or posterior oropharyngeal erythema.  Eyes:     General: No scleral icterus.       Right eye: No discharge.        Left eye: No discharge.     Extraocular Movements: Extraocular movements intact.     Conjunctiva/sclera: Conjunctivae normal.     Pupils: Pupils are equal, round, and reactive to light.  Neck:     Vascular: No carotid bruit.  Cardiovascular:     Rate and Rhythm: Normal rate and regular rhythm.  Pulmonary:     Effort: Pulmonary effort is normal.     Breath sounds: Normal breath sounds.  Abdominal:     General: Bowel sounds are normal.  Musculoskeletal:     Cervical back: Normal. No rigidity or tenderness.     Lumbar back: Normal.       Back:  Lymphadenopathy:     Cervical: No cervical adenopathy.  Skin:    General: Skin is warm and dry.  Neurological:     General: No focal deficit present.     Mental Status: He is alert and oriented to person, place, and time.     Cranial Nerves: No cranial nerve deficit.     Motor: No weakness, tremor or abnormal muscle tone.  Psychiatric:        Mood and Affect: Mood normal.        Behavior: Behavior normal.    BP 130/82 (BP Location: Right Arm, Patient Position: Sitting, Cuff Size: Large)   Pulse 66   Temp (!) 97.5 F (36.4 C) (Temporal)   Ht 5\' 11"  (1.803 m)   Wt 247 lb 3.2 oz (112.1 kg)   SpO2 97%   BMI 34.48 kg/m  Wt Readings from Last 3 Encounters:  08/19/20 247 lb 3.2 oz (112.1 kg)  05/02/20 254 lb 12.8 oz (115.6 kg)  10/25/19 252 lb 6.4 oz (114.5 kg)      Health Maintenance Due  Topic Date Due   Pneumococcal Vaccine 94-38 Years old (1 - PCV) Never done   HIV Screening  Never done   Hepatitis C Screening  Never done   Zoster Vaccines- Shingrix (1 of 2) Never done   COLONOSCOPY (Pts 45-11yrs Insurance coverage will need to  be confirmed)  Never done   INFLUENZA VACCINE  08/11/2020    There are no preventive care reminders to display for this patient.  Lab Results  Component Value Date   TSH 2.39 10/25/2019   Lab Results  Component Value Date   WBC 7.3 10/25/2019   HGB 14.9 10/25/2019   HCT 44.3 10/25/2019   MCV 92.7 10/25/2019   PLT 235.0 10/25/2019   Lab Results  Component Value Date   NA 141 05/02/2020   K 3.6 05/02/2020   CO2 24 05/02/2020   GLUCOSE 92 05/02/2020   BUN 12 05/02/2020   CREATININE 1.14 05/02/2020   BILITOT 0.6 11/21/2018   ALKPHOS 76 11/21/2018   AST 21 11/21/2018   ALT 31 11/21/2018   PROT 7.0 11/21/2018   ALBUMIN 4.3 11/21/2018   CALCIUM 9.2 05/02/2020   GFR 67.54 10/25/2019   Lab Results  Component Value Date   CHOL 185 11/21/2018   Lab Results  Component Value Date   HDL 45.30 11/21/2018   Lab Results  Component Value Date   LDLCALC 121 (H) 11/21/2018   Lab Results  Component Value Date   TRIG 95.0 11/21/2018   Lab Results  Component Value Date   CHOLHDL 4 11/21/2018   Lab Results  Component Value Date   HGBA1C 5.7 10/25/2019      Assessment & Plan:   Problem List Items Addressed This Visit       Other   Dysesthesia affecting both sides of body   Relevant Orders   MR Brain W Wo Contrast   Numbness and tingling - Primary   Relevant Orders   MR Brain W Wo Contrast    No orders of the defined types were placed in this encounter.   Follow-up: No follow-ups on file.   With ongoing nature of his paresthesias/dysesthesias randomly involving both sides of his body would have to consider CNS disease.  Will check MRI of brain.  Consider neurology follow-up.  Will  continue llisinopril with HCTZ.  I discussed this medication with him.  He has not had no issue and has taken it for years issue. It has controlled his blood pressure well.  I spent over 40 minutes with this patient taking his history, reviewing the chart and performing his physical exam. Mliss Sax, MD

## 2020-08-23 ENCOUNTER — Ambulatory Visit (HOSPITAL_BASED_OUTPATIENT_CLINIC_OR_DEPARTMENT_OTHER)
Admission: RE | Admit: 2020-08-23 | Discharge: 2020-08-23 | Disposition: A | Discharge status: Home / Self Care | Payer: No Typology Code available for payment source | Source: Ambulatory Visit | Attending: Family Medicine | Admitting: Family Medicine

## 2020-08-23 ENCOUNTER — Other Ambulatory Visit: Payer: Self-pay

## 2020-08-23 DIAGNOSIS — R208 Other disturbances of skin sensation: Secondary | ICD-10-CM | POA: Insufficient documentation

## 2020-08-23 DIAGNOSIS — R200 Anesthesia of skin: Secondary | ICD-10-CM | POA: Insufficient documentation

## 2020-08-23 DIAGNOSIS — R202 Paresthesia of skin: Secondary | ICD-10-CM | POA: Diagnosis present

## 2020-08-23 MED ORDER — GADOBUTROL 1 MMOL/ML IV SOLN
10.0000 mL | Freq: Once | INTRAVENOUS | Status: AC | PRN
  Administered 2020-08-23: 10 mL via INTRAVENOUS

## 2020-12-10 ENCOUNTER — Other Ambulatory Visit (HOSPITAL_COMMUNITY): Payer: Self-pay

## 2020-12-10 MED FILL — Lisinopril & Hydrochlorothiazide Tab 10-12.5 MG: ORAL | 90 days supply | Qty: 90 | Fill #1 | Status: AC

## 2020-12-12 ENCOUNTER — Other Ambulatory Visit (HOSPITAL_COMMUNITY): Payer: Self-pay

## 2020-12-12 MED ORDER — PREVIDENT 5000 SENSITIVE 1.1-5 % DT GEL
DENTAL | 3 refills | Status: DC
  Filled 2020-12-12: qty 100, 33d supply, fill #0

## 2021-04-03 ENCOUNTER — Other Ambulatory Visit (HOSPITAL_COMMUNITY): Payer: Self-pay

## 2021-04-03 ENCOUNTER — Ambulatory Visit (INDEPENDENT_AMBULATORY_CARE_PROVIDER_SITE_OTHER): Payer: No Typology Code available for payment source | Admitting: Family Medicine

## 2021-04-03 ENCOUNTER — Encounter: Payer: Self-pay | Admitting: Family Medicine

## 2021-04-03 ENCOUNTER — Other Ambulatory Visit: Payer: Self-pay

## 2021-04-03 VITALS — BP 138/90 | HR 94 | Temp 97.0°F | Ht 71.0 in | Wt 249.4 lb

## 2021-04-03 DIAGNOSIS — F419 Anxiety disorder, unspecified: Secondary | ICD-10-CM | POA: Diagnosis not present

## 2021-04-03 DIAGNOSIS — I10 Essential (primary) hypertension: Secondary | ICD-10-CM

## 2021-04-03 DIAGNOSIS — L509 Urticaria, unspecified: Secondary | ICD-10-CM

## 2021-04-03 DIAGNOSIS — Z1211 Encounter for screening for malignant neoplasm of colon: Secondary | ICD-10-CM

## 2021-04-03 DIAGNOSIS — K219 Gastro-esophageal reflux disease without esophagitis: Secondary | ICD-10-CM | POA: Diagnosis not present

## 2021-04-03 DIAGNOSIS — F341 Dysthymic disorder: Secondary | ICD-10-CM | POA: Diagnosis not present

## 2021-04-03 MED ORDER — OMEPRAZOLE 40 MG PO CPDR
40.0000 mg | DELAYED_RELEASE_CAPSULE | Freq: Every day | ORAL | 3 refills | Status: DC
Start: 2021-04-03 — End: 2021-05-11
  Filled 2021-04-03: qty 30, 30d supply, fill #0

## 2021-04-03 MED ORDER — ESCITALOPRAM OXALATE 10 MG PO TABS
10.0000 mg | ORAL_TABLET | Freq: Every day | ORAL | 2 refills | Status: DC
  Filled 2021-04-03: qty 30, 30d supply, fill #0

## 2021-04-03 NOTE — Progress Notes (Signed)
? ?Established Patient Office Visit ? ?Subjective:  ?Patient ID: Carlos Burns, male    DOB: [birth year]  Age: 51 y.o. MRN: [id number] ? ?CC:  ?Chief Complaint  ?Patient presents with  ? Follow-up  ?  Pt want to discuss colonoscopy and stress.  ? ? ?HPI ?Carlos Burns presents for evaluation and treatment of stress and follow-up of hypertension.  Blood pressure remains well controlled with Zestoretic.  Has been dealing with stress over the last year or so.  There have been deaths in the family.  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  Has not been sleeping well at night.  History of spring and fall allergies.  He has been having eruptions of hives.  Also has been noticing reflux with burning up into the chest area.  Relieved with Tums.  Paresthesias noted at last visit ? ?Past Medical History:  ?Diagnosis Date  ? ASTHMA 02/29/2008  ? DERMATITIS 06/20/2009  ? Headache(784.0) 06/25/2009  ? Hypertension   ? ? ?Past Surgical History:  ?Procedure Laterality Date  ? WISDOM TOOTH EXTRACTION    ? ? ?Family History  ?Problem Relation Age of Onset  ? Hypertension Mother   ? Diabetes Mother   ? Hypertension Father   ? Stroke Maternal Grandmother   ? Hypertension Maternal Grandmother   ? Stroke Maternal Grandfather   ? Diabetes Maternal Grandfather   ? Stroke Paternal Grandmother   ? Diabetes Paternal Grandmother   ? Hypertension Paternal Grandmother   ? Diabetes Paternal Grandfather   ? Hypertension Paternal Grandfather   ? ? ?Social History  ? ?Socioeconomic History  ? Marital status: Married  ?  Spouse name: Not on file  ? Number of children: Not on file  ? Years of education: Not on file  ? Highest education level: Not on file  ?Occupational History  ? Not on file  ?Tobacco Use  ? Smoking status: Never  ? Smokeless tobacco: Never  ?Vaping Use  ? Vaping Use: Never used  ?Substance and Sexual Activity  ? Alcohol use: Yes  ?  Alcohol/week: 1.0 standard drink  ?  Types: 1 Cans of beer per week  ?  Comment: occ  ? Drug  use: No  ? Sexual activity: Yes  ?Other Topics Concern  ? Not on file  ?Social History Narrative  ? Not on file  ? ?Social Determinants of Health  ? ?Financial Resource Strain: Not on file  ?Food Insecurity: Not on file  ?Transportation Needs: Not on file  ?Physical Activity: Not on file  ?Stress: Not on file  ?Social Connections: Not on file  ?Intimate Partner Violence: Not on file  ? ? ?Outpatient Medications Prior to Visit  ?Medication Sig Dispense Refill  ? albuterol (PROAIR HFA) 108 (90 Base) MCG/ACT inhaler Inhale 2 puffs into the lungs every 6 (six) hours as needed for wheezing or shortness of breath. 18 g 2  ? cetirizine (ZYRTEC) 10 MG tablet Take 10 mg by mouth as needed.     ? cyclobenzaprine (FLEXERIL) 5 MG tablet Take 1 tablet (5 mg total) by mouth at bedtime as needed for muscle spasms. 30 tablet 0  ? Fluticasone-Salmeterol (ADVAIR DISKUS) 100-50 MCG/DOSE AEPB INHALE 1 PUFF INTO THE LUNGS AS NEEDED. 60 each 12  ? lisinopril-hydrochlorothiazide (ZESTORETIC) 10-12.5 MG tablet TAKE 1 TABLET BY MOUTH DAILY. 90 tablet 3  ? PREVIDENT 5000 SENSITIVE 1.1-5 % GEL Brush three times daily. (Patient not taking: Reported on 04/03/2021) 100 mL 3  ? ?  No facility-administered medications prior to visit.  ? ? ?No Known Allergies ? ?ROS ?Review of Systems  ?Constitutional:  Negative for chills, diaphoresis, fatigue, fever and unexpected weight change.  ?HENT: Negative.    ?Eyes:  Negative for photophobia and visual disturbance.  ?Respiratory: Negative.  Negative for chest tightness and shortness of breath.   ?Cardiovascular: Negative.  Negative for chest pain and palpitations.  ?Gastrointestinal: Negative.   ?Endocrine: Negative for polyphagia and polyuria.  ?Genitourinary: Negative.   ?Musculoskeletal:  Negative for gait problem and joint swelling.  ?Neurological:  Negative for speech difficulty, weakness and light-headedness.  ? ?  04/03/2021  ? 10:27 AM 08/19/2020  ?  8:06 AM 07/10/2019  ? 11:55 AM  ?Depression screen PHQ  2/9  ?Decreased Interest 0 0 0  ?Down, Depressed, Hopeless 0 0 0  ?PHQ - 2 Score 0 0 0  ?Altered sleeping   0  ?Tired, decreased energy   0  ?Change in appetite   0  ?Feeling bad or failure about yourself    0  ?Trouble concentrating   0  ?Moving slowly or fidgety/restless   0  ?Suicidal thoughts   0  ?PHQ-9 Score   0  ?Difficult doing work/chores   Not difficult at all  ? ? ? ?  ?Objective:  ?  ?Physical Exam ?Vitals and nursing note reviewed.  ?Constitutional:   ?   Appearance: Normal appearance.  ?HENT:  ?   Head: Normocephalic and atraumatic.  ?   Mouth/Throat:  ?   Mouth: Mucous membranes are moist.  ?   Pharynx: Oropharynx is clear. No oropharyngeal exudate or posterior oropharyngeal erythema.  ?Eyes:  ?   General: No scleral icterus.    ?   Right eye: No discharge.     ?   Left eye: No discharge.  ?   Extraocular Movements: Extraocular movements intact.  ?   Conjunctiva/sclera: Conjunctivae normal.  ?Cardiovascular:  ?   Rate and Rhythm: Normal rate and regular rhythm.  ?Pulmonary:  ?   Effort: Pulmonary effort is normal.  ?   Breath sounds: Normal breath sounds.  ?Musculoskeletal:  ?   Cervical back: No rigidity or tenderness.  ?Lymphadenopathy:  ?   Cervical: No cervical adenopathy.  ?Neurological:  ?   Mental Status: He is alert.  ? ? ?BP 138/90 (BP Location: Left Arm, Patient Position: Sitting, Cuff Size: Normal)   Pulse 94   Temp (!) 97 ?F (36.1 ?C) (Temporal)   Ht 5\' 11"  (1.803 m)   Wt 249 lb 6.4 oz (113.1 kg)   SpO2 98%   BMI 34.78 kg/m?  ?Wt Readings from Last 3 Encounters:  ?04/03/21 249 lb 6.4 oz (113.1 kg)  ?08/19/20 247 lb 3.2 oz (112.1 kg)  ?05/02/20 254 lb 12.8 oz (115.6 kg)  ? ? ? ?Health Maintenance Due  ?Topic Date Due  ? HIV Screening  Never done  ? Hepatitis C Screening  Never done  ? Zoster Vaccines- Shingrix (1 of 2) Never done  ? COLONOSCOPY (Pts 45-726yrs Insurance coverage will need to be confirmed)  Never done  ? INFLUENZA VACCINE  08/11/2020  ? ? ?There are no preventive care  reminders to display for this patient. ? ?Lab Results  ?Component Value Date  ? TSH 2.39 10/25/2019  ? ?Lab Results  ?Component Value Date  ? WBC 7.3 10/25/2019  ? HGB 14.9 10/25/2019  ? HCT 44.3 10/25/2019  ? MCV 92.7 10/25/2019  ? PLT 235.0 10/25/2019  ? ?Lab  Results  ?Component Value Date  ? NA 141 05/02/2020  ? K 3.6 05/02/2020  ? CO2 24 05/02/2020  ? GLUCOSE 92 05/02/2020  ? BUN 12 05/02/2020  ? CREATININE 1.14 05/02/2020  ? BILITOT 0.6 11/21/2018  ? ALKPHOS 76 11/21/2018  ? AST 21 11/21/2018  ? ALT 31 11/21/2018  ? PROT 7.0 11/21/2018  ? ALBUMIN 4.3 11/21/2018  ? CALCIUM 9.2 05/02/2020  ? GFR 67.54 10/25/2019  ? ?Lab Results  ?Component Value Date  ? CHOL 185 11/21/2018  ? ?Lab Results  ?Component Value Date  ? HDL 45.30 11/21/2018  ? ?Lab Results  ?Component Value Date  ? LDLCALC 121 (H) 11/21/2018  ? ?Lab Results  ?Component Value Date  ? TRIG 95.0 11/21/2018  ? ?Lab Results  ?Component Value Date  ? CHOLHDL 4 11/21/2018  ? ?Lab Results  ?Component Value Date  ? HGBA1C 5.7 10/25/2019  ? ? ?  ?Assessment & Plan:  ? ?Problem List Items Addressed This Visit   ? ?  ? Cardiovascular and Mediastinum  ? Essential hypertension, benign  ?  ? Digestive  ? Gastroesophageal reflux disease  ? Relevant Medications  ? omeprazole (PRILOSEC) 40 MG capsule  ?  ? Musculoskeletal and Integument  ? Urticaria  ?  ? Other  ? Anxiety  ? Relevant Medications  ? escitalopram (LEXAPRO) 10 MG tablet  ? Dysthymia - Primary  ? Relevant Medications  ? escitalopram (LEXAPRO) 10 MG tablet  ? ?Other Visit Diagnoses   ? ? Screen for colon cancer      ? Relevant Orders  ? Ambulatory referral to Gastroenterology  ? ?  ? ? ?Meds ordered this encounter  ?Medications  ? escitalopram (LEXAPRO) 10 MG tablet  ?  Sig: Take 1 tablet (10 mg total) by mouth at bedtime.  ?  Dispense:  30 tablet  ?  Refill:  2  ? omeprazole (PRILOSEC) 40 MG capsule  ?  Sig: Take 1 capsule (40 mg total) by mouth daily.  ?  Dispense:  30 capsule  ?  Refill:  3   ? ? ?Follow-up: Return in about 5 weeks (around 05/08/2021).  ?We will take Zyrtec regularly for his allergies and urticaria.  We will start omeprazole for reflux.  We will take Lexapro for stress.  He will just time to be

## 2021-05-08 ENCOUNTER — Ambulatory Visit: Payer: No Typology Code available for payment source | Admitting: Family Medicine

## 2021-05-11 ENCOUNTER — Other Ambulatory Visit (HOSPITAL_COMMUNITY): Payer: Self-pay

## 2021-05-11 ENCOUNTER — Encounter: Payer: Self-pay | Admitting: Family Medicine

## 2021-05-11 ENCOUNTER — Ambulatory Visit (INDEPENDENT_AMBULATORY_CARE_PROVIDER_SITE_OTHER): Payer: No Typology Code available for payment source | Admitting: Family Medicine

## 2021-05-11 VITALS — BP 124/80 | HR 85 | Temp 97.3°F | Ht 71.0 in | Wt 247.2 lb

## 2021-05-11 DIAGNOSIS — K219 Gastro-esophageal reflux disease without esophagitis: Secondary | ICD-10-CM

## 2021-05-11 DIAGNOSIS — I10 Essential (primary) hypertension: Secondary | ICD-10-CM

## 2021-05-11 DIAGNOSIS — F419 Anxiety disorder, unspecified: Secondary | ICD-10-CM | POA: Diagnosis not present

## 2021-05-11 DIAGNOSIS — J4520 Mild intermittent asthma, uncomplicated: Secondary | ICD-10-CM

## 2021-05-11 MED ORDER — OMEPRAZOLE 40 MG PO CPDR
40.0000 mg | DELAYED_RELEASE_CAPSULE | Freq: Every day | ORAL | 3 refills | Status: DC
  Filled 2021-05-11: qty 30, 30d supply, fill #0

## 2021-05-11 MED ORDER — ALBUTEROL SULFATE HFA 108 (90 BASE) MCG/ACT IN AERS
2.0000 | INHALATION_SPRAY | Freq: Four times a day (QID) | RESPIRATORY_TRACT | 2 refills | Status: DC | PRN
  Filled 2021-05-11: qty 18, 25d supply, fill #0
  Filled 2022-04-27: qty 6.7, 25d supply, fill #1

## 2021-05-11 MED ORDER — LISINOPRIL-HYDROCHLOROTHIAZIDE 10-12.5 MG PO TABS
1.0000 | ORAL_TABLET | Freq: Every day | ORAL | 3 refills | Status: DC
  Filled 2021-05-11: qty 90, 90d supply, fill #0

## 2021-05-11 NOTE — Progress Notes (Signed)
? ?Established Patient Office Visit ? ?Subjective   ?Patient ID: Carlos Burns, male    DOB: 10/11/1970  Age: 51 y.o. MRN: [id number] ? ?Chief Complaint  ?Patient presents with  ? Follow-up  ?  5 week follow on GERD and anxiety, patient states that he did not start Lexapro.   ? ? ?HPI follow-up of dysthymia, reflux hypertension and history of asthma.  It has not been a problem as an adult as much.  Rarely uses inhaler.  1 inhaler is lasted him a year.  Stress levels have decreased markedly.  His wife is returning home from visiting her family in the [place]  Omeprazole has helped his reflux.  He initially experienced some nausea.  Blood pressure well ? ? ? ?Review of Systems  ?Constitutional: Negative.   ?HENT: Negative.    ?Eyes:  Negative for blurred vision, discharge and redness.  ?Respiratory: Negative.    ?Cardiovascular: Negative.   ?Gastrointestinal:  Negative for abdominal pain.  ?Genitourinary: Negative.   ?Musculoskeletal: Negative.  Negative for myalgias.  ?Skin:  Negative for rash.  ?Neurological:  Negative for tingling, loss of consciousness and weakness.  ?Endo/Heme/Allergies:  Negative for polydipsia.  ?Psychiatric/Behavioral:  Negative for depression. The patient is not nervous/anxious.   ? ?  ? ?  05/11/2021  ?  8:53 AM 04/03/2021  ? 10:27 AM 08/19/2020  ?  8:06 AM  ?Depression screen PHQ 2/9  ?Decreased Interest 0 0 0  ?Down, Depressed, Hopeless 0 0 0  ?PHQ - 2 Score 0 0 0  ? ? ? ?Objective:  ?  ? ?BP 124/80 (BP Location: Right Arm, Patient Position: Sitting, Cuff Size: Large)   Pulse 85   Temp (!) 97.3 ?F (36.3 ?C) (Temporal)   Ht 5\' 11"  (1.803 m)   Wt 247 lb 3.2 oz (112.1 kg)   SpO2 97%   BMI 34.48 kg/m?  ? ? ?Physical Exam ?Constitutional:   ?   General: He is not in acute distress. ?   Appearance: Normal appearance. He is not ill-appearing, toxic-appearing or diaphoretic.  ?HENT:  ?   Head: Normocephalic and atraumatic.  ?   Right Ear: External ear normal.  ?   Left Ear: External ear  normal.  ?Eyes:  ?   General: No scleral icterus.    ?   Right eye: No discharge.     ?   Left eye: No discharge.  ?   Extraocular Movements: Extraocular movements intact.  ?   Conjunctiva/sclera: Conjunctivae normal.  ?Cardiovascular:  ?   Rate and Rhythm: Normal rate and regular rhythm.  ?Pulmonary:  ?   Effort: Pulmonary effort is normal. No respiratory distress.  ?   Breath sounds: Normal breath sounds.  ?Abdominal:  ?   General: Bowel sounds are normal.  ?Musculoskeletal:  ?   Cervical back: No rigidity or tenderness.  ?Skin: ?   General: Skin is warm and dry.  ?Neurological:  ?   Mental Status: He is alert and oriented to person, place, and time.  ?Psychiatric:     ?   Mood and Affect: Mood normal.     ?   Behavior: Behavior normal.  ? ? ? ?No results found for any visits on 05/11/21. ? ? ? ?The 10-year ASCVD risk score (Arnett DK, et al., 2019) is: 8.7% ? ?  ?Assessment & Plan:  ? ?Problem List Items Addressed This Visit   ? ?  ? Cardiovascular and Mediastinum  ? Essential hypertension, benign - Primary  ?  Relevant Medications  ? lisinopril-hydrochlorothiazide (ZESTORETIC) 10-12.5 MG tablet  ?  ? Respiratory  ? Mild intermittent asthma without complication  ? Relevant Medications  ? albuterol (PROAIR HFA) 108 (90 Base) MCG/ACT inhaler  ?  ? Digestive  ? Gastroesophageal reflux disease  ? Relevant Medications  ? omeprazole (PRILOSEC) 40 MG capsule  ?  ? Other  ? Anxiety  ? ? ?Return in about 6 months (around 11/11/2021), or Return fasting for physical in 6 months.Marland Kitchen  ?Continue omeprazole for 2 to 3 months and then hold.  Uses albuterol sparingly and intermittently.  Continue Zestoretic for hypertension.  In the fall for physical. ? ?Mliss Sax, MD ? ?

## 2021-06-03 ENCOUNTER — Other Ambulatory Visit (HOSPITAL_COMMUNITY): Payer: Self-pay

## 2021-06-03 ENCOUNTER — Ambulatory Visit (INDEPENDENT_AMBULATORY_CARE_PROVIDER_SITE_OTHER): Payer: No Typology Code available for payment source | Admitting: Family Medicine

## 2021-06-03 ENCOUNTER — Encounter: Payer: Self-pay | Admitting: Family Medicine

## 2021-06-03 VITALS — BP 122/78 | HR 69 | Temp 98.5°F | Ht 71.0 in | Wt 247.2 lb

## 2021-06-03 DIAGNOSIS — I10 Essential (primary) hypertension: Secondary | ICD-10-CM

## 2021-06-03 DIAGNOSIS — Z0000 Encounter for general adult medical examination without abnormal findings: Secondary | ICD-10-CM

## 2021-06-03 DIAGNOSIS — Z125 Encounter for screening for malignant neoplasm of prostate: Secondary | ICD-10-CM | POA: Diagnosis not present

## 2021-06-03 DIAGNOSIS — Z23 Encounter for immunization: Secondary | ICD-10-CM | POA: Diagnosis not present

## 2021-06-03 DIAGNOSIS — K219 Gastro-esophageal reflux disease without esophagitis: Secondary | ICD-10-CM | POA: Diagnosis not present

## 2021-06-03 LAB — HEMOGLOBIN A1C: Hgb A1c MFr Bld: 5.5 % (ref 4.6–6.5)

## 2021-06-03 LAB — CBC WITH DIFFERENTIAL/PLATELET
Basophils Absolute: 0 10*3/uL (ref 0.0–0.1)
Basophils Relative: 0.1 % (ref 0.0–3.0)
Eosinophils Absolute: 0.2 10*3/uL (ref 0.0–0.7)
Eosinophils Relative: 2.4 % (ref 0.0–5.0)
HCT: 47.7 % (ref 39.0–52.0)
Hemoglobin: 15.9 g/dL (ref 13.0–17.0)
Lymphocytes Relative: 23.8 % (ref 12.0–46.0)
Lymphs Abs: 1.6 10*3/uL (ref 0.7–4.0)
MCHC: 33.3 g/dL (ref 30.0–36.0)
MCV: 93.8 fl (ref 78.0–100.0)
Monocytes Absolute: 0.4 10*3/uL (ref 0.1–1.0)
Monocytes Relative: 6 % (ref 3.0–12.0)
Neutro Abs: 4.5 10*3/uL (ref 1.4–7.7)
Neutrophils Relative %: 67.7 % (ref 43.0–77.0)
Platelets: 249 10*3/uL (ref 150.0–400.0)
RBC: 5.09 Mil/uL (ref 4.22–5.81)
RDW: 13.9 % (ref 11.5–15.5)
WBC: 6.7 10*3/uL (ref 4.0–10.5)

## 2021-06-03 LAB — LIPID PANEL
Cholesterol: 205 mg/dL — ABNORMAL HIGH (ref 0–200)
HDL: 44.4 mg/dL (ref >39.00)
LDL Cholesterol: 136 mg/dL — ABNORMAL HIGH (ref 0–99)
NonHDL: 160.17
Total CHOL/HDL Ratio: 5
Triglycerides: 123 mg/dL (ref 0.0–149.0)
VLDL: 24.6 mg/dL (ref 0.0–40.0)

## 2021-06-03 LAB — COMPREHENSIVE METABOLIC PANEL
ALT: 30 U/L (ref 0–53)
AST: 22 U/L (ref 0–37)
Albumin: 4.6 g/dL (ref 3.5–5.2)
Alkaline Phosphatase: 85 U/L (ref 39–117)
BUN: 10 mg/dL (ref 6–23)
CO2: 31 mEq/L (ref 19–32)
Calcium: 9.8 mg/dL (ref 8.4–10.5)
Chloride: 100 mEq/L (ref 96–112)
Creatinine, Ser: 1.22 mg/dL (ref 0.40–1.50)
GFR: 68.78 mL/min (ref >60.00)
Glucose, Bld: 92 mg/dL (ref 70–99)
Potassium: 4.1 mEq/L (ref 3.5–5.1)
Sodium: 137 mEq/L (ref 135–145)
Total Bilirubin: 0.5 mg/dL (ref 0.2–1.2)
Total Protein: 7.6 g/dL (ref 6.0–8.3)

## 2021-06-03 LAB — URINALYSIS
Bilirubin Urine: NEGATIVE
Ketones, ur: NEGATIVE
Leukocytes,Ua: NEGATIVE
Nitrite: NEGATIVE
Specific Gravity, Urine: 1.01 (ref 1.000–1.030)
Total Protein, Urine: NEGATIVE
Urine Glucose: NEGATIVE
Urobilinogen, UA: 0.2 (ref 0.0–1.0)
pH: 7.5 (ref 5.0–8.0)

## 2021-06-03 LAB — PSA: PSA: 0.86 ng/mL (ref 0.10–4.00)

## 2021-06-03 MED ORDER — LISINOPRIL-HYDROCHLOROTHIAZIDE 10-12.5 MG PO TABS
1.0000 | ORAL_TABLET | Freq: Every day | ORAL | 3 refills | Status: DC
  Filled 2021-06-03 – 2021-08-16 (×2): qty 90, 90d supply, fill #0
  Filled 2022-01-27: qty 90, 90d supply, fill #1
  Filled 2022-04-27: qty 90, 90d supply, fill #2

## 2021-06-03 MED ORDER — PANTOPRAZOLE SODIUM 40 MG PO TBEC
40.0000 mg | DELAYED_RELEASE_TABLET | Freq: Every day | ORAL | 3 refills | Status: DC
  Filled 2021-06-03 – 2021-06-12 (×2): qty 30, 30d supply, fill #0
  Filled 2021-08-16: qty 30, 30d supply, fill #1

## 2021-06-03 NOTE — Progress Notes (Signed)
Established Patient Office Visit  Subjective   Patient ID: Carlos Burns, male    DOB: 07/09/1970  Age: 51 y.o. MRN: [id number]  Chief Complaint  Patient presents with   Annual Exam    CPE, no concerns. Patient fasting.     HPI for physical exam today in follow-up of hypertension, dysthymia, allergy rhinitis with hives and reflux disease.  Omeprazole has helped but it does not entirely relieve his symptoms.  Feels that he is mostly at night when he lies down.  He does not smoke.  There is no shortness of breath nausea diaphoresis or chest pain.  Decided not to try the Lexapro.  Things are improved when his wife returned home from [place].  Blood pressure has been controlled with Zestoretic is tolerating the drug well.  He has regular dental care.  No dedicated exercise.  He decided to use Allegra for his allergy symptoms and hives instead of Zyrtec for because that medication has made him drowsy.  Allegra seems to be working well for this.    Review of Systems  Constitutional: Negative.  Negative for chills, diaphoresis, malaise/fatigue and weight loss.  HENT: Negative.    Eyes:  Negative for blurred vision, discharge and redness.  Respiratory: Negative.  Negative for shortness of breath.   Cardiovascular: Negative.  Negative for chest pain.  Gastrointestinal:  Negative for abdominal pain, heartburn and vomiting.  Genitourinary: Negative.   Musculoskeletal: Negative.  Negative for myalgias.  Skin:  Negative for rash.  Neurological:  Negative for tingling, loss of consciousness and weakness.  Endo/Heme/Allergies:  Negative for polydipsia.  Psychiatric/Behavioral:  Negative for depression. The patient is not nervous/anxious.      Objective:     BP 122/78 (BP Location: Right Arm, Patient Position: Sitting, Cuff Size: Large)   Pulse 69   Temp 98.5 F (36.9 C) (Oral)   Ht 5\' 11"  (1.803 m)   Wt 247 lb 3.2 oz (112.1 kg)   SpO2 98%   BMI 34.48 kg/m    Physical  Exam Constitutional:      General: He is not in acute distress.    Appearance: Normal appearance. He is not ill-appearing, toxic-appearing or diaphoretic.  HENT:     Head: Normocephalic and atraumatic.     Right Ear: External ear normal.     Left Ear: External ear normal.     Mouth/Throat:     Mouth: Mucous membranes are moist.     Pharynx: Oropharynx is clear. No oropharyngeal exudate or posterior oropharyngeal erythema.  Eyes:     General: No scleral icterus.       Right eye: No discharge.        Left eye: No discharge.     Extraocular Movements: Extraocular movements intact.     Conjunctiva/sclera: Conjunctivae normal.     Pupils: Pupils are equal, round, and reactive to light.  Cardiovascular:     Rate and Rhythm: Normal rate and regular rhythm.  Pulmonary:     Effort: Pulmonary effort is normal. No respiratory distress.     Breath sounds: Normal breath sounds.  Abdominal:     General: Bowel sounds are normal.     Tenderness: There is no abdominal tenderness. There is no guarding.     Hernia: There is no hernia in the left inguinal area or right inguinal area.  Genitourinary:    Penis: Circumcised. No hypospadias, erythema, tenderness, discharge, swelling or lesions.      Testes:  Right: Mass, tenderness or swelling not present. Right testis is descended.        Left: Mass, tenderness or swelling not present. Left testis is descended.     Epididymis:     Right: Not inflamed or enlarged.     Left: Not inflamed or enlarged.     Prostate: Enlarged. Not tender and no nodules present.     Rectum: Guaiac result negative. No mass, tenderness, anal fissure, external hemorrhoid or internal hemorrhoid. Normal anal tone.  Musculoskeletal:     Cervical back: No rigidity or tenderness.  Lymphadenopathy:     Lower Body: No right inguinal adenopathy. No left inguinal adenopathy.  Skin:    General: Skin is warm and dry.  Neurological:     Mental Status: He is alert and oriented  to person, place, and time.  Psychiatric:        Mood and Affect: Mood normal.        Behavior: Behavior normal.     No results found for any visits on 06/03/21.    The 10-year ASCVD risk score (Arnett DK, et al., 2019) is: 8.5%    Assessment & Plan:   Problem List Items Addressed This Visit       Cardiovascular and Mediastinum   Essential hypertension, benign   Relevant Medications   lisinopril-hydrochlorothiazide (ZESTORETIC) 10-12.5 MG tablet   Other Relevant Orders   CBC with Differential/Platelet   Comprehensive metabolic panel   Urinalysis     Digestive   Gastroesophageal reflux disease   Relevant Medications   pantoprazole (PROTONIX) 40 MG tablet     Other   Healthcare maintenance - Primary   Relevant Orders   Hemoglobin A1c   Lipid panel   PSA   Need for shingles vaccine   Relevant Orders   Varicella-zoster vaccine IM    Return in about 3 months (around 09/03/2021).  We will discontinue omeprazole and start pantoprazole.  He will consume his evening meal earlier and being upright position a few hours before bedtime.  Start exercising by walking 30 minutes at least 5 days weekly.  Continue Zestoretic for hypertension.  Continue Allegra for allergy rhinitis urticaria.  Information was given on health maintenance and disease prevention.  Follow-up in 3 months for recheck on reflux.  Second shingles vaccine in 3 months.  Mliss Sax, MD

## 2021-06-11 ENCOUNTER — Other Ambulatory Visit (HOSPITAL_COMMUNITY): Payer: Self-pay

## 2021-06-12 ENCOUNTER — Other Ambulatory Visit (HOSPITAL_COMMUNITY): Payer: Self-pay

## 2021-06-12 ENCOUNTER — Ambulatory Visit: Payer: No Typology Code available for payment source | Admitting: *Deleted

## 2021-06-12 ENCOUNTER — Telehealth: Payer: Self-pay | Admitting: *Deleted

## 2021-06-12 VITALS — Ht 71.0 in | Wt 244.4 lb

## 2021-06-12 DIAGNOSIS — Z1211 Encounter for screening for malignant neoplasm of colon: Secondary | ICD-10-CM

## 2021-06-12 MED ORDER — NA SULFATE-K SULFATE-MG SULF 17.5-3.13-1.6 GM/177ML PO SOLN
1.0000 | Freq: Once | ORAL | 0 refills | Status: AC
  Filled 2021-06-12: qty 354, 1d supply, fill #0

## 2021-06-12 NOTE — Telephone Encounter (Signed)
KW, I am okay with the patient having an endoscopy diagnostic placed for severe acid reflux.  He should initiate his PPI in the interim.  If he wants to have that done at the time of his colonoscopy I am okay with that.  If he wants to have the PPI initiated and then see how that does before he has an endoscopy that is fine as well.  If he is having any symptoms of dysphagia I certainly think an endoscopy makes sense upfront.  Let me know what he decides and what insurance approves. Thanks. GM

## 2021-06-12 NOTE — Progress Notes (Signed)
  No egg or soy allergy  No home oxygen use   No medications for weight loss taken  emmi information given  Pt denies constipation issues  Pt informed that we do not do prior authorizations for prep   No trouble with anesthesia, denies trouble moving neck, or hx/fam hx of malignant hyperthermia per pt  See TE 06-12-21- pt asks to have an EGD done d/t severe reflux.  No OV available prior to procedure on 06-24-21- I sent Dr. Meridee Score a TE to ask if ok to add on an EGD that day.  1000 spot on 06-24-21 blocked; pt's appt is at 9:30 am, so if ok to add on EGD, pt can have at 10:00 am

## 2021-06-12 NOTE — Telephone Encounter (Signed)
Dr. Meridee Score,  I saw this pt at [facility] today.  He is coming in on the 14th for a screening colonoscopy.  He states that he has been having severe reflux, waking him up at noc previously.  His PCP has prescribed Pantoprazole- he hasn't started this yet.  His PCP told him to mention this at his visit today.  I checked your schedule, as well as the extenders, and there isn't an OV available prior to his procedure.  Your 10:00 am appt is open that day (he is the 9:30 am).  Is it ok to add on an EGD or would you like an OV first.  Thanks, [employer]

## 2021-06-17 NOTE — Telephone Encounter (Signed)
Contacted patient and advised he has been ok'd for both colonoscopy and EGD.

## 2021-06-19 ENCOUNTER — Other Ambulatory Visit (HOSPITAL_COMMUNITY): Payer: Self-pay

## 2021-06-22 ENCOUNTER — Encounter: Payer: Self-pay | Admitting: Gastroenterology

## 2021-06-24 ENCOUNTER — Encounter: Payer: No Typology Code available for payment source | Admitting: Gastroenterology

## 2021-06-24 ENCOUNTER — Ambulatory Visit (AMBULATORY_SURGERY_CENTER): Payer: No Typology Code available for payment source | Admitting: Gastroenterology

## 2021-06-24 ENCOUNTER — Encounter: Payer: Self-pay | Admitting: Gastroenterology

## 2021-06-24 VITALS — BP 120/82 | HR 64 | Temp 98.4°F | Resp 15 | Ht 71.0 in | Wt 244.0 lb

## 2021-06-24 DIAGNOSIS — K449 Diaphragmatic hernia without obstruction or gangrene: Secondary | ICD-10-CM

## 2021-06-24 DIAGNOSIS — K219 Gastro-esophageal reflux disease without esophagitis: Secondary | ICD-10-CM | POA: Diagnosis not present

## 2021-06-24 DIAGNOSIS — Z1211 Encounter for screening for malignant neoplasm of colon: Secondary | ICD-10-CM

## 2021-06-24 DIAGNOSIS — K2970 Gastritis, unspecified, without bleeding: Secondary | ICD-10-CM

## 2021-06-24 DIAGNOSIS — K2100 Gastro-esophageal reflux disease with esophagitis, without bleeding: Secondary | ICD-10-CM

## 2021-06-24 DIAGNOSIS — K209 Esophagitis, unspecified without bleeding: Secondary | ICD-10-CM

## 2021-06-24 MED ORDER — SODIUM CHLORIDE 0.9 % IV SOLN
500.0000 mL | Freq: Once | INTRAVENOUS | Status: DC
Start: 2021-06-24 — End: 2021-06-24

## 2021-06-24 NOTE — Op Note (Signed)
[facility] Patient Name: Carlos Burns Procedure Date: 06/24/2021 9:58 AM MRN: [id number] Endoscopist: Corliss Parish , MD Age: 51 Referring MD:  Date of Birth: [birth year] Gender: Male Account #: [id number] Procedure:                Upper GI endoscopy Indications:              Heartburn, Exclusion of gastro-esophageal reflux                            disease Medicines:                Monitored Anesthesia Care Procedure:                Pre-Anesthesia Assessment:                           - Prior to the procedure, a History and Physical                            was performed, and patient medications and                            allergies were reviewed. The patient's tolerance of                            previous anesthesia was also reviewed. The risks                            and benefits of the procedure and the sedation                            options and risks were discussed with the patient.                            All questions were answered, and informed consent                            was obtained. Prior Anticoagulants: The patient has                            taken no previous anticoagulant or antiplatelet                            agents. ASA Grade Assessment: II - A patient with                            mild systemic disease. After reviewing the risks                            and benefits, the patient was deemed in                            satisfactory condition to undergo the procedure.  After obtaining informed consent, the endoscope was                            passed under direct vision. Throughout the                            procedure, the patient's blood pressure, pulse, and                            oxygen saturations were monitored continuously. The                            GIF [id number] #[id number] was introduced through the                            mouth, and advanced to the second part of  duodenum.                            The upper GI endoscopy was accomplished without                            difficulty. The patient tolerated the procedure. Scope In: Scope Out: Findings:                 No gross lesions were noted in the majority of the                            esophagus. Biopsies were taken with a cold forceps                            for histology to rule out EoE/LoE.                           LA Grade B (one or more mucosal breaks greater than                            5 mm, not extending between the tops of two mucosal                            folds) esophagitis with no bleeding was found in                            the very distal esophagus.                           The Z-line was irregular and was found 41 cm from                            the incisors.                           A 1 cm hiatal hernia was present.  Localized moderately erythematous mucosa without                            bleeding was found in the gastric antrum and in the                            prepyloric region of the stomach.                           No other gross lesions were noted in the entire                            examined stomach. Biopsies were taken with a cold                            forceps for histology and Helicobacter pylori                            testing.                           No gross lesions were noted in the duodenal bulb,                            in the first portion of the duodenum and in the                            second portion of the duodenum. Complications:            No immediate complications. Estimated Blood Loss:     Estimated blood loss was minimal. Impression:               - No gross lesions in majority of esophagus.                            Biopsied.                           - LA Grade B reflux esophagitis with no bleeding                            distally.                           - Z-line  irregular, 41 cm from the incisors.                           - 1 cm hiatal hernia.                           - Erythematous mucosa in the antrum and prepyloric                            region of the stomach. No other gross lesions in  the stomach. Biopsied.                           - No gross lesions in the duodenal bulb, in the                            first portion of the duodenum and in the second                            portion of the duodenum. Recommendation:           - Proceed to scheduled colonoscopy.                           - Recommend starting Protonix 40 mg daily to aid in                            healing (though patient is feeling better with                            dietary modifications).                           - Continue present medications.                           - Await pathology results.                           - Consider repeat upper endoscopy in 4 months to                            check healing of esophagitis.                           - The findings and recommendations were discussed                            with the patient.                           - The findings and recommendations were discussed                            with the patient's family. Corliss Parish, MD 06/24/2021 10:31:22 AM

## 2021-06-24 NOTE — Progress Notes (Signed)
GASTROENTEROLOGY PROCEDURE H&P NOTE   Primary Care Physician: Mliss Sax, MD  HPI: Carlos Burns is a 51 y.o. male who presents for EGD/Colonoscopy for GERD despite PPI therapy, Colon Cancer screening.  Past Medical History:  Diagnosis Date   Allergy    Anxiety    ASTHMA 02/29/2008   Asthma    DERMATITIS 06/20/2009   GERD (gastroesophageal reflux disease)    Headache(784.0) 06/25/2009   Hypertension    Past Surgical History:  Procedure Laterality Date   WISDOM TOOTH EXTRACTION     Current Outpatient Medications  Medication Sig Dispense Refill   albuterol (PROAIR HFA) 108 (90 Base) MCG/ACT inhaler Inhale 2 puffs into the lungs every 6 (six) hours as needed for wheezing or shortness of breath. 18 g 2   cetirizine (ZYRTEC) 10 MG tablet Take 10 mg by mouth as needed.      Fluticasone-Salmeterol (ADVAIR DISKUS) 100-50 MCG/DOSE AEPB INHALE 1 PUFF INTO THE LUNGS AS NEEDED. 60 each 12   lisinopril-hydrochlorothiazide (ZESTORETIC) 10-12.5 MG tablet Take 1 tablet by mouth daily. 90 tablet 3   pantoprazole (PROTONIX) 40 MG tablet Take 1 tablet (40 mg total) by mouth daily. 30 tablet 3   No current facility-administered medications for this visit.    Current Outpatient Medications:    albuterol (PROAIR HFA) 108 (90 Base) MCG/ACT inhaler, Inhale 2 puffs into the lungs every 6 (six) hours as needed for wheezing or shortness of breath., Disp: 18 g, Rfl: 2   cetirizine (ZYRTEC) 10 MG tablet, Take 10 mg by mouth as needed. , Disp: , Rfl:    Fluticasone-Salmeterol (ADVAIR DISKUS) 100-50 MCG/DOSE AEPB, INHALE 1 PUFF INTO THE LUNGS AS NEEDED., Disp: 60 each, Rfl: 12   lisinopril-hydrochlorothiazide (ZESTORETIC) 10-12.5 MG tablet, Take 1 tablet by mouth daily., Disp: 90 tablet, Rfl: 3   pantoprazole (PROTONIX) 40 MG tablet, Take 1 tablet (40 mg total) by mouth daily., Disp: 30 tablet, Rfl: 3 No Known Allergies Family History  Problem Relation Age of Onset   Hypertension  Mother    Diabetes Mother    Hypertension Father    Colon cancer Maternal Uncle    Stroke Maternal Grandmother    Hypertension Maternal Grandmother    Stroke Maternal Grandfather    Diabetes Maternal Grandfather    Stroke Paternal Grandmother    Diabetes Paternal Grandmother    Hypertension Paternal Grandmother    Diabetes Paternal Grandfather    Hypertension Paternal Grandfather    Esophageal cancer Neg Hx    Stomach cancer Neg Hx    Rectal cancer Neg Hx    Social History   Socioeconomic History   Marital status: Married    Spouse name: Not on file   Number of children: Not on file   Years of education: Not on file   Highest education level: Not on file  Occupational History   Not on file  Tobacco Use   Smoking status: Never   Smokeless tobacco: Never  Vaping Use   Vaping Use: Never used  Substance and Sexual Activity   Alcohol use: Never    Alcohol/week: 1.0 standard drink of alcohol    Types: 1 Cans of beer per week   Drug use: No   Sexual activity: Yes  Other Topics Concern   Not on file  Social History Narrative   Not on file   Social Determinants of Health   Financial Resource Strain: Not on file  Food Insecurity: Not on file  Transportation Needs: Not on  file  Physical Activity: Not on file  Stress: Not on file  Social Connections: Not on file  Intimate Partner Violence: Not on file    Physical Exam: There were no vitals filed for this visit. There is no height or weight on file to calculate BMI. GEN: NAD EYE: Sclerae anicteric ENT: MMM CV: Non-tachycardic GI: Soft, NT/ND NEURO:  Alert & Oriented x 3  Lab Results: No results for input(s): "WBC", "HGB", "HCT", "PLT" in the last 72 hours. BMET No results for input(s): "NA", "K", "CL", "CO2", "GLUCOSE", "BUN", "CREATININE", "CALCIUM" in the last 72 hours. LFT No results for input(s): "PROT", "ALBUMIN", "AST", "ALT", "ALKPHOS", "BILITOT", "BILIDIR", "IBILI" in the last 72 hours. PT/INR No  results for input(s): "LABPROT", "INR" in the last 72 hours.   Impression / Plan: This is a 51 y.o.male  who presents for EGD/Colonoscopy for GERD despite PPI therapy, Colon Cancer screening.  The risks and benefits of endoscopic evaluation/treatment were discussed with the patient and/or family; these include but are not limited to the risk of perforation, infection, bleeding, missed lesions, lack of diagnosis, severe illness requiring hospitalization, as well as anesthesia and sedation related illnesses.  The patient's history has been reviewed, patient examined, no change in status, and deemed stable for procedure.  The patient and/or family is agreeable to proceed.    Corliss Parish, MD [facility] Advanced [facility]oscopy Office # [phone]

## 2021-06-24 NOTE — Progress Notes (Signed)
Pt's states no medical or surgical changes since previsit or office visit. 

## 2021-06-24 NOTE — Progress Notes (Signed)
Sedate, gd SR, tolerated procedure well, VSS, report to RN 

## 2021-06-24 NOTE — Progress Notes (Signed)
Called to room to assist during endoscopic procedure.  Patient ID and intended procedure confirmed with present staff. Received instructions for my participation in the procedure from the performing physician.  

## 2021-06-24 NOTE — Op Note (Signed)
Carlos Burns Patient Name: Elward Nocera Procedure Date: 06/24/2021 9:58 AM MRN: [id number] Endoscopist: Justice Britain , MD Age: 51 Referring MD:  Date of Birth: Aug 31, 1970 Gender: Male Account #: [id number] Procedure:                Colonoscopy Indications:              Screening for colorectal malignant neoplasm Medicines:                Monitored Anesthesia Care Procedure:                Pre-Anesthesia Assessment:                           - Prior to the procedure, a History and Physical                            was performed, and patient medications and                            allergies were reviewed. The patient's tolerance of                            previous anesthesia was also reviewed. The risks                            and benefits of the procedure and the sedation                            options and risks were discussed with the patient.                            All questions were answered, and informed consent                            was obtained. Prior Anticoagulants: The patient has                            taken no previous anticoagulant or antiplatelet                            agents. ASA Grade Assessment: II - A patient with                            mild systemic disease. After reviewing the risks                            and benefits, the patient was deemed in                            satisfactory condition to undergo the procedure.                           After obtaining informed consent, the colonoscope  was passed under direct vision. Throughout the                            procedure, the patient's blood pressure, pulse, and                            oxygen saturations were monitored continuously. The                            CF [id number] #[id number] was introduced through the anus                            and advanced to the 5 cm into the ileum. The                            colonoscopy was  performed without difficulty. The                            patient tolerated the procedure. The quality of the                            bowel preparation was good. The terminal ileum,                            ileocecal valve, appendiceal orifice, and rectum                            were photographed. Scope In: 10:17:19 AM Scope Out: 10:24:58 AM Scope Withdrawal Time: 0 hours 6 minutes 2 seconds  Total Procedure Duration: 0 hours 7 minutes 39 seconds  Findings:                 The digital rectal exam findings include                            hemorrhoids. Pertinent negatives include no                            palpable rectal lesions.                           The terminal ileum and ileocecal valve appeared                            normal.                           Normal mucosa was found in the entire colon.                           Non-bleeding non-thrombosed internal hemorrhoids                            were found during retroflexion, during perianal  exam and during digital exam. The hemorrhoids were                            Grade II (internal hemorrhoids that prolapse but                            reduce spontaneously). Complications:            No immediate complications. Estimated Blood Loss:     Estimated blood loss was minimal. Impression:               - Hemorrhoids found on digital rectal exam.                           - The examined portion of the ileum was normal.                           - Normal mucosa in the entire examined colon.                           - Non-bleeding non-thrombosed internal hemorrhoids. Recommendation:           - The patient will be observed post-procedure,                            until all discharge criteria are met.                           - Discharge patient to home.                           - Patient has a contact number available for                            emergencies. The signs and  symptoms of potential                            delayed complications were discussed with the                            patient. Return to normal activities tomorrow.                            Written discharge instructions were provided to the                            patient.                           - High fiber diet.                           - Use FiberCon 1-2 tablets PO daily.                           - Continue present medications.                           -  Repeat colonoscopy in 10 years for screening                            purposes.                           - The findings and recommendations were discussed                            with the patient.                           - The findings and recommendations were discussed                            with the patient's family. Justice Britain, MD 06/24/2021 10:33:39 AM

## 2021-06-24 NOTE — Patient Instructions (Signed)
Await pathology results.  Protonix 40mg  daily by mouth.  (Refills remaining on current prescription).   Consider repeating upper endoscopy to check for healing in 4 months.  YOU HAD AN ENDOSCOPIC PROCEDURE TODAY AT THE [facility]:   Refer to the procedure report that was given to you for any specific questions about what was found during the examination.  If the procedure report does not answer your questions, please call your gastroenterologist to clarify.  If you requested that your care partner not be given the details of your procedure findings, then the procedure report has been included in a sealed envelope for you to review at your convenience later.  YOU SHOULD EXPECT: Some feelings of bloating in the abdomen. Passage of more gas than usual.  Walking can help get rid of the air that was put into your GI tract during the procedure and reduce the bloating. If you had a lower endoscopy (such as a colonoscopy or flexible sigmoidoscopy) you may notice spotting of blood in your stool or on the toilet paper. If you underwent a bowel prep for your procedure, you may not have a normal bowel movement for a few days.  Please Note:  You might notice some irritation and congestion in your nose or some drainage.  This is from the oxygen used during your procedure.  There is no need for concern and it should clear up in a day or so.  SYMPTOMS TO REPORT IMMEDIATELY:  Following lower endoscopy (colonoscopy or flexible sigmoidoscopy):  Excessive amounts of blood in the stool  Significant tenderness or worsening of abdominal pains  Swelling of the abdomen that is new, acute  Fever of 100F or higher  Following upper endoscopy (EGD)  Vomiting of blood or coffee ground material  New chest pain or pain under the shoulder blades  Painful or persistently difficult swallowing  New shortness of breath  Fever of 100F or higher  Black, tarry-looking stools  For urgent or emergent issues, a  gastroenterologist can be reached at any hour by calling [phone]. Do not use MyChart messaging for urgent concerns.    DIET:  We do recommend a small meal at first, but then you may proceed to your regular diet.  Drink plenty of fluids but you should avoid alcoholic beverages for 24 hours.  ACTIVITY:  You should plan to take it easy for the rest of today and you should NOT DRIVE or use heavy machinery until tomorrow (because of the sedation medicines used during the test).    FOLLOW UP: Our staff will call the number listed on your records 24-72 hours following your procedure to check on you and address any questions or concerns that you may have regarding the information given to you following your procedure. If we do not reach you, we will leave a message.  We will attempt to reach you two times.  During this call, we will ask if you have developed any symptoms of COVID 19. If you develop any symptoms (ie: fever, flu-like symptoms, shortness of breath, cough etc.) before then, please call [phone].  If you test positive for Covid 19 in the 2 weeks post procedure, please call and report this information to [phone].    If any biopsies were taken you will be contacted by phone or by letter within the next 1-3 weeks.  Please call us at [phone] if you have not heard about the biopsies in 3 weeks.    SIGNATURES/CONFIDENTIALITY: You and/or your  care partner have signed paperwork which will be entered into your electronic medical record.  These signatures attest to the fact that that the information above on your After Visit Summary has been reviewed and is understood.  Full responsibility of the confidentiality of this discharge information lies with you and/or your care-partner.

## 2021-06-25 ENCOUNTER — Telehealth: Payer: Self-pay

## 2021-06-25 NOTE — Telephone Encounter (Signed)
  Follow up Call-     06/24/2021    8:48 AM  Call back number  Post procedure Call Back phone  # [phone]  Permission to leave phone message Yes     Patient questions:  Do you have a fever, pain , or abdominal swelling? No. Pain Score  0 *  Have you tolerated food without any problems? Yes.    Have you been able to return to your normal activities? Yes.    Do you have any questions about your discharge instructions: Diet   No. Medications  No. Follow up visit  No.  Do you have questions or concerns about your Care? No.  Actions: * If pain score is 4 or above: No action needed, pain <4.

## 2021-06-26 ENCOUNTER — Encounter: Payer: Self-pay | Admitting: Gastroenterology

## 2021-08-17 ENCOUNTER — Other Ambulatory Visit (HOSPITAL_COMMUNITY): Payer: Self-pay

## 2021-08-20 ENCOUNTER — Ambulatory Visit: Payer: No Typology Code available for payment source | Admitting: Gastroenterology

## 2021-09-07 ENCOUNTER — Ambulatory Visit (INDEPENDENT_AMBULATORY_CARE_PROVIDER_SITE_OTHER): Payer: No Typology Code available for payment source | Admitting: Family Medicine

## 2021-09-07 ENCOUNTER — Encounter: Payer: Self-pay | Admitting: Family Medicine

## 2021-09-07 VITALS — BP 120/82 | HR 57 | Temp 97.2°F | Ht 71.0 in | Wt 252.6 lb

## 2021-09-07 DIAGNOSIS — K219 Gastro-esophageal reflux disease without esophagitis: Secondary | ICD-10-CM

## 2021-09-07 DIAGNOSIS — E7800 Pure hypercholesterolemia, unspecified: Secondary | ICD-10-CM | POA: Diagnosis not present

## 2021-09-07 DIAGNOSIS — Z23 Encounter for immunization: Secondary | ICD-10-CM

## 2021-09-07 NOTE — Progress Notes (Signed)
Established Patient Office Visit  Subjective   Patient ID: Carlos Burns, male    DOB: 1970-04-09  Age: 51 y.o. MRN: [id number]  Chief Complaint  Patient presents with   Follow-up    HPI doing well.  GERD symptoms resolved after 6 weeks of Protonix and have not returned over the last month or so.  His children are back at school.  He continues to work from home and will not have time for exercising.  He is planning on exercising and losing weight.    Review of Systems  Constitutional: Negative.   HENT: Negative.    Eyes:  Negative for blurred vision, discharge and redness.  Respiratory: Negative.    Cardiovascular: Negative.   Gastrointestinal:  Negative for abdominal pain.  Genitourinary: Negative.   Musculoskeletal: Negative.  Negative for myalgias.  Skin:  Negative for rash.  Neurological:  Negative for tingling, loss of consciousness and weakness.  Endo/Heme/Allergies:  Negative for polydipsia.      Objective:     BP 120/82 (BP Location: Left Arm, Patient Position: Sitting, Cuff Size: Large)   Pulse (!) 57   Temp (!) 97.2 F (36.2 C) (Temporal)   Ht 5\' 11"  (1.803 m)   Wt 252 lb 9.6 oz (114.6 kg)   SpO2 97%   BMI 35.23 kg/m  BP Readings from Last 3 Encounters:  09/07/21 120/82  06/24/21 120/82  06/03/21 122/78   Wt Readings from Last 3 Encounters:  09/07/21 252 lb 9.6 oz (114.6 kg)  06/24/21 244 lb (110.7 kg)  06/12/21 244 lb 6.4 oz (110.9 kg)      Physical Exam Constitutional:      General: He is not in acute distress.    Appearance: Normal appearance. He is not ill-appearing, toxic-appearing or diaphoretic.  HENT:     Head: Normocephalic and atraumatic.     Right Ear: External ear normal.     Left Ear: External ear normal.  Eyes:     General: No scleral icterus.       Right eye: No discharge.        Left eye: No discharge.     Extraocular Movements: Extraocular movements intact.     Conjunctiva/sclera: Conjunctivae normal.   Cardiovascular:     Rate and Rhythm: Normal rate and regular rhythm.  Pulmonary:     Effort: Pulmonary effort is normal. No respiratory distress.     Breath sounds: Normal breath sounds.  Skin:    General: Skin is warm and dry.  Neurological:     Mental Status: He is alert and oriented to person, place, and time.  Psychiatric:        Mood and Affect: Mood normal.        Behavior: Behavior normal.      No results found for any visits on 09/07/21.    The 10-year ASCVD risk score (Arnett DK, et al., 2019) is: 8.5%    Assessment & Plan:   Problem List Items Addressed This Visit       Digestive   Gastroesophageal reflux disease     Other   Need for shingles vaccine   Relevant Orders   Varicella-zoster vaccine IM   Elevated LDL cholesterol level   Need for influenza vaccination - Primary   Relevant Orders   Flu Vaccine QUAD 6+ mos PF IM (Fluarix Quad PF)    Return in about 1 year (around 09/08/2022).  He is planning on starting an exercise routine and losing weight.  He  was given information on the Mediterranean diet regarding his intermediate risk for vascular disease.  GERD symptoms have resolved.  Mliss Sax, MD

## 2021-09-15 ENCOUNTER — Ambulatory Visit: Payer: No Typology Code available for payment source | Admitting: Family Medicine

## 2021-09-30 ENCOUNTER — Other Ambulatory Visit (HOSPITAL_COMMUNITY): Payer: Self-pay

## 2021-09-30 MED ORDER — CHLORHEXIDINE GLUCONATE 0.12 % MT SOLN
15.0000 mL | Freq: Two times a day (BID) | OROMUCOSAL | 2 refills | Status: AC
  Filled 2021-09-30: qty 473, 16d supply, fill #0

## 2021-10-01 ENCOUNTER — Ambulatory Visit: Payer: No Typology Code available for payment source | Admitting: Gastroenterology

## 2021-10-27 ENCOUNTER — Encounter: Payer: Self-pay | Admitting: Gastroenterology

## 2021-10-27 ENCOUNTER — Ambulatory Visit: Payer: No Typology Code available for payment source | Admitting: Gastroenterology

## 2021-10-27 VITALS — BP 124/72 | HR 71 | Ht 71.0 in | Wt 250.5 lb

## 2021-10-27 DIAGNOSIS — K219 Gastro-esophageal reflux disease without esophagitis: Secondary | ICD-10-CM

## 2021-10-27 DIAGNOSIS — Z1211 Encounter for screening for malignant neoplasm of colon: Secondary | ICD-10-CM

## 2021-10-27 DIAGNOSIS — K2100 Gastro-esophageal reflux disease with esophagitis, without bleeding: Secondary | ICD-10-CM

## 2021-10-27 NOTE — Patient Instructions (Signed)
Send my chart message if you have worsening symptoms of Gerd.   Follow up in 1 year. Sooner, if needed.   _______________________________________________________  If you are age 51 or older, your body mass index should be between 23-30. Your Body mass index is 34.94 kg/m. If this is out of the aforementioned range listed, please consider follow up with your Primary Care Provider.  If you are age 65 or younger, your body mass index should be between 19-25. Your Body mass index is 34.94 kg/m. If this is out of the aformentioned range listed, please consider follow up with your Primary Care Provider.   ________________________________________________________  The  GI providers would like to encourage you to use [facility] to communicate with providers for non-urgent requests or questions.  Due to long hold times on the telephone, sending your provider a message by [facility] may be a faster and more efficient way to get a response.  Please allow 48 business hours for a response.  Please remember that this is for non-urgent requests.  _______________________________________________________  Thank you for choosing me and [facility].  Dr. Rush Landmark

## 2021-10-29 ENCOUNTER — Encounter: Payer: Self-pay | Admitting: Gastroenterology

## 2021-10-29 DIAGNOSIS — Z1211 Encounter for screening for malignant neoplasm of colon: Secondary | ICD-10-CM | POA: Insufficient documentation

## 2021-10-29 NOTE — Progress Notes (Signed)
[facility] VISIT   Primary Care Provider Libby Maw, MD [place] [address] [phone]  Patient Profile: Carlos Burns is a 51 y.o. male with a pmh significant for hypertension, anxiety, asthma, obesity, GERD (with esophagitis).  The patient presents to the [facility] for an evaluation and management of problem(s) noted below:  Problem List 1. Gastroesophageal reflux disease with esophagitis without hemorrhage   2. Colon cancer screening     History of Present Illness This is a patient that I met in June for further evaluation in the setting of colon cancer screening and with progressive heartburn symptoms while on therapy.  We performed an upper endoscopy and colonoscopy at the same time.  The upper endoscopy did show evidence of grade B esophagitis and some mild gastritis that was negative for H. pylori.  He had no evidence of EOE esophageal biopsies.  His colonoscopy was clean and he was given a 10-year surveillance interval.  He is here today to discuss his symptoms and how he is done.  Patient states he is doing quite well.  The patient was taking his therapy as indicated and over the course the last few months has actually been able to come off all therapy.  He is only had 2 episodes in the last 4 months where he has felt a little bit of increased heartburn which occurred dietarily when he was eating something that was spicy or red sauce related.  He is quite happy with where things stand.  He denies any dysphagia symptoms or odynophagia symptoms.  His weight has been stable.  He is not experiencing any new abdominal pain or discomfort or changes in his bowel habits.  GI Review of Systems Positive as above Negative for early satiety, change in appetite, melena, hematochezia  Review of Systems General: Denies fevers/chills/weight loss unintentionally Cardiovascular: Denies chest pain Pulmonary:  Denies shortness of breath Gastroenterological: See HPI Genitourinary: Denies darkened urine Hematological: Denies easy bruising/bleeding Dermatological: Denies jaundice Psychological: Mood is stable   Medications Current Outpatient Medications  Medication Sig Dispense Refill   albuterol (PROAIR HFA) 108 (90 Base) MCG/ACT inhaler Inhale 2 puffs into the lungs every 6 (six) hours as needed for wheezing or shortness of breath. 18 g 2   cetirizine (ZYRTEC) 10 MG tablet Take 10 mg by mouth as needed.      chlorhexidine (PERIDEX) 0.12 % solution Rinse with 15 mLs  for 30 seconds and expectorate twice a day 473 mL 2   Fluticasone-Salmeterol (ADVAIR DISKUS) 100-50 MCG/DOSE AEPB INHALE 1 PUFF INTO THE LUNGS AS NEEDED. 60 each 12   lisinopril-hydrochlorothiazide (ZESTORETIC) 10-12.5 MG tablet Take 1 tablet by mouth daily. 90 tablet 3   No current facility-administered medications for this visit.    Allergies No Known Allergies  Histories Past Medical History:  Diagnosis Date   Allergy    Anxiety    ASTHMA 02/29/2008   Asthma    DERMATITIS 06/20/2009   GERD (gastroesophageal reflux disease)    Headache(784.0) 06/25/2009   Hypertension    Past Surgical History:  Procedure Laterality Date   WISDOM TOOTH EXTRACTION     Social History   Socioeconomic History   Marital status: Married    Spouse name: Not on file   Number of children: Not on file   Years of education: Not on file   Highest education level: Not on file  Occupational History   Not on file  Tobacco Use  Smoking status: Never   Smokeless tobacco: Never  Vaping Use   Vaping Use: Never used  Substance and Sexual Activity   Alcohol use: Never    Alcohol/week: 1.0 standard drink of alcohol    Types: 1 Cans of beer per week   Drug use: No   Sexual activity: Yes  Other Topics Concern   Not on file  Social History Narrative   Not on file   Social Determinants of Health   Financial Resource Strain: Not on file   Food Insecurity: Not on file  Transportation Needs: Not on file  Physical Activity: Not on file  Stress: Not on file  Social Connections: Not on file  Intimate Partner Violence: Not on file   Family History  Problem Relation Age of Onset   Hypertension Mother    Diabetes Mother    Hypertension Father    Colon cancer Maternal Uncle    Stroke Maternal Grandmother    Hypertension Maternal Grandmother    Stroke Maternal Grandfather    Diabetes Maternal Grandfather    Stroke Paternal Grandmother    Diabetes Paternal Grandmother    Hypertension Paternal Grandmother    Diabetes Paternal Grandfather    Hypertension Paternal Grandfather    Esophageal cancer Neg Hx    Stomach cancer Neg Hx    Rectal cancer Neg Hx    Inflammatory bowel disease Neg Hx    Liver disease Neg Hx    Pancreatic cancer Neg Hx    I have reviewed his medical, social, and family history in detail and updated the electronic medical record as necessary.    PHYSICAL EXAMINATION  BP 124/72   Pulse 71   Ht _0  (1.803 m)   Wt 250 lb 8 oz (113.6 kg)   BMI 34.94 kg/m  Wt Readings from Last 3 Encounters:  10/27/21 250 lb 8 oz (113.6 kg)  09/07/21 252 lb 9.6 oz (114.6 kg)  06/24/21 244 lb (110.7 kg)  GEN: NAD, appears stated age, doesn't appear chronically ill PSYCH: Cooperative, without pressured speech EYE: Conjunctivae pink, sclerae anicteric ENT: MMM CV: Nontachycardic RESP: No audible wheezing GI: NABS, soft, protuberant abdomen, rounded, nontender, without rebound or guarding  MSK/EXT: No significant lower extremity edema SKIN: No jaundice NEURO:  Alert & Oriented x 3, no focal deficits   REVIEW OF DATA  I reviewed the following data at the time of this encounter:  GI Procedures and Studies  June 2023 EGD - No gross lesions in majority of esophagus. Biopsied. - LA Grade B reflux esophagitis with no bleeding distally. - Z-line irregular, 41 cm from the incisors. - 1 cm hiatal hernia. -  Erythematous mucosa in the antrum and prepyloric region of the stomach. No other gross lesions in the stomach. Biopsied. - No gross lesions in the duodenal bulb, in the first portion of the duodenum and in the second portion of the duodenum.  June 2023 colonoscopy - Hemorrhoids found on digital rectal exam. - The examined portion of the ileum was normal. - Normal mucosa in the entire examined colon. - Non-bleeding non-thrombosed internal hemorrhoids.  Pathology Diagnosis 1. Surgical [P], gastric - GASTRIC OXYNTIC AND OXYNTOMUCINOUS MUCOSA WITH NO SPECIFIC PATHOLOGIC DIAGNOSIS. - NEGATIVE FOR AN INFLAMMATORY PATTERN PREDICTIVE OF HELICOBACTER PYLORI INFECTION. - NEGATIVE FOR INTESTINAL METAPLASIA AND MALIGNANCY. 2. Surgical [P], esophagus - SQUAMOUS MUCOSA WITH NO SPECIFIC PATHOLOGIC DIAGNOSIS. - NEGATIVE FOR INTRASQUAMOUS EOSINOPHILS. - NO VIRAL CYTOPATHIC CHANGE OR FUNGI IDENTIFIED (ON H&E). - NEGATIVE FOR DYSPLASIA AND MALIGNANCY. -  NO GLANDULAR MUCOSA PRESENT.  Laboratory Studies  Reviewed those in epic  Imaging Studies  No relevant studies to review   ASSESSMENT  Mr. Bauer is a 51 y.o. male with a pmh significant for hypertension, anxiety, asthma, obesity, GERD (with esophagitis).  The patient is seen today for evaluation and management of:  1. Gastroesophageal reflux disease with esophagitis without hemorrhage   2. Colon cancer screening    The patient is hemodynamically and clinically stable at this time.  He has evidence of grade B esophagitis time of his recent endoscopy in June.  He seems to have been doing quite well and has actually been able to down titrate off his PPI therapy completely.  He is only had 2 episodes over the course of the last 68-montheven while has been off PPI therapy.  Since the patient is doing quite well and not having any further symptoms I think it is okay to not repeat upper endoscopic evaluation for now.  Should patient have recurrent  symptoms, I recommend as needed use/on-demand use of H2 RA Pepcid if issues become more frequent during the course of the week happening multiple times per week then he may need to restart PPI therapy.  If he does need to continue that for a 1 month.  And let u[place] about that.  If the patient starts having more significant symptoms, then repeat upper endoscopic evaluation will need to be considered and potential further/continue PPI therapy in the future will need to be decided upon.  I think if you make some further lifestyle modifications things may be helpful for him in regards to weight loss and other things as outlined below.  Hopefully we can just see how the patient is doing in a year and that he has done well and before from there.  He will be due for colon cancer screening in 10 years due to his clean colonoscopy this year.  All patient questions were answered to the best of my ability, and the patient agrees to the aforementioned plan of action with follow-up as indicated.   PLAN  Patient will monitor his symptoms Use H2 RA Pepcid on demand if infrequent episodes of GERD/heartburn occur If symptoms become more frequent then may need to do a 1 month PPI once daily trial If symptoms become more frequent or require multiple longer periods of PPI use, consider restarting daily PPI Hold on repeat upper endoscopic evaluation unless other symptoms develop Patient will MyChart u[place]if other issues develop Colon cancer screening in 2033 Follow-up in 1 year GERD Lifestyle modifications to trial -Eat meals >3 hours before bedtime -Do not lay down or lie flat immediately after eating for at least 2-hours -Do not overeat (decrease portion size and/or eat more frequent smaller meals) -Eat slowly -Wear Loose-fitting clothes -Raise Head of Bed (Head & Chest > Feet with bed blocks) -Avoid foods that trigger the symptoms (Onions, Chocolate, Caffeine, Spicy, and Fatty) -Work on Losing Weight -Avoid  Alcohol -Increase Exercise Activity -Maintain Heartburn Diary/Log   No orders of the defined types were placed in this encounter.   New Prescriptions   No medications on file   Modified Medications   No medications on file    Planned Follow Up No follow-ups on file.   Total Time in Face-to-Face and in Coordination of Care for patient including independent/personal interpretation/review of prior testing, medical history, examination, medication adjustment, communicating results with the patient directly, and documentation within the EHR is 25 minutes.  Justice Britain, MD [facility] Advanced Endoscopy Office # [phone]

## 2021-11-11 ENCOUNTER — Ambulatory Visit: Payer: No Typology Code available for payment source | Admitting: Family Medicine

## 2022-03-22 ENCOUNTER — Encounter: Payer: Self-pay | Admitting: Family Medicine

## 2022-03-22 ENCOUNTER — Ambulatory Visit (INDEPENDENT_AMBULATORY_CARE_PROVIDER_SITE_OTHER): Payer: 59 | Admitting: Family Medicine

## 2022-03-22 VITALS — BP 148/92 | HR 81 | Temp 99.0°F | Wt 258.1 lb

## 2022-03-22 DIAGNOSIS — I10 Essential (primary) hypertension: Secondary | ICD-10-CM

## 2022-03-22 DIAGNOSIS — J029 Acute pharyngitis, unspecified: Secondary | ICD-10-CM | POA: Diagnosis not present

## 2022-03-22 LAB — POCT INFLUENZA A/B
Influenza A, POC: NEGATIVE
Influenza B, POC: NEGATIVE

## 2022-03-22 LAB — POCT RAPID STREP A (OFFICE): Rapid Strep A Screen: NEGATIVE

## 2022-03-22 LAB — POC COVID19 BINAXNOW: SARS Coronavirus 2 Ag: NEGATIVE

## 2022-03-22 NOTE — Progress Notes (Signed)
Established Patient Office Visit   Subjective:  Patient ID: Carlos Burns, male    DOB: June 16, 1970  Age: 52 y.o. MRN: [id number]  Chief Complaint  Patient presents with   Sore Throat    Since Saturday, painful when swallowing, no cough    Sore Throat  Associated symptoms include headaches. Pertinent negatives include no abdominal pain, coughing or shortness of breath.   Encounter Diagnoses  Name Primary?   Sore throat Yes   Essential hypertension, benign    Presents with a 2-day history of headache, sore throat, myalgias.  Temperature.  Denies cough, nausea or vomiting, difficulty breathing or wheezing.  Has not taken BP medicine yesterday or today.   Review of Systems  Constitutional: Negative.   HENT:  Positive for sore throat.   Eyes:  Negative for blurred vision, discharge and redness.  Respiratory: Negative.  Negative for cough, shortness of breath and wheezing.   Cardiovascular: Negative.   Gastrointestinal:  Negative for abdominal pain.  Genitourinary: Negative.   Musculoskeletal:  Positive for myalgias.  Skin:  Negative for rash.  Neurological:  Positive for headaches. Negative for tingling, loss of consciousness and weakness.  Endo/Heme/Allergies:  Negative for polydipsia.     Current Outpatient Medications:    albuterol (PROAIR HFA) 108 (90 Base) MCG/ACT inhaler, Inhale 2 puffs into the lungs every 6 (six) hours as needed for wheezing or shortness of breath., Disp: 18 g, Rfl: 2   cetirizine (ZYRTEC) 10 MG tablet, Take 10 mg by mouth as needed. , Disp: , Rfl:    chlorhexidine (PERIDEX) 0.12 % solution, Rinse with 15 mLs  for 30 seconds and expectorate twice a day, Disp: 473 mL, Rfl: 2   Fluticasone-Salmeterol (ADVAIR DISKUS) 100-50 MCG/DOSE AEPB, INHALE 1 PUFF INTO THE LUNGS AS NEEDED., Disp: 60 each, Rfl: 12   lisinopril-hydrochlorothiazide (ZESTORETIC) 10-12.5 MG tablet, Take 1 tablet by mouth daily., Disp: 90 tablet, Rfl: 3   Objective:     BP (!)  148/92   Pulse 81   Temp 99 F (37.2 C) (Oral)   Wt 258 lb 2 oz (117.1 kg)   SpO2 98%   BMI 36.00 kg/m    Physical Exam Constitutional:      General: He is not in acute distress.    Appearance: Normal appearance. He is not ill-appearing, toxic-appearing or diaphoretic.  HENT:     Head: Normocephalic and atraumatic.     Right Ear: Tympanic membrane, ear canal and external ear normal.     Left Ear: Tympanic membrane, ear canal and external ear normal.     Mouth/Throat:     Mouth: Mucous membranes are moist.     Pharynx: Oropharynx is clear. Posterior oropharyngeal erythema present. No oropharyngeal exudate.     Tonsils: No tonsillar abscesses. 1+ on the right. 1+ on the left.  Eyes:     General: No scleral icterus.       Right eye: No discharge.        Left eye: No discharge.     Extraocular Movements: Extraocular movements intact.     Conjunctiva/sclera: Conjunctivae normal.     Pupils: Pupils are equal, round, and reactive to light.  Cardiovascular:     Rate and Rhythm: Normal rate and regular rhythm.  Pulmonary:     Effort: Pulmonary effort is normal. No respiratory distress.     Breath sounds: Normal breath sounds.  Abdominal:     General: Bowel sounds are normal.     Tenderness: There is  no abdominal tenderness. There is no guarding.  Musculoskeletal:     Cervical back: No rigidity or tenderness.  Lymphadenopathy:     Cervical:     Right cervical: No superficial or deep cervical adenopathy.    Left cervical: No superficial or deep cervical adenopathy.  Skin:    General: Skin is warm and dry.  Neurological:     Mental Status: He is alert and oriented to person, place, and time.  Psychiatric:        Mood and Affect: Mood normal.        Behavior: Behavior normal.      Results for orders placed or performed in visit on 03/22/22  POC COVID-19  Result Value Ref Range   SARS Coronavirus 2 Ag Negative Negative  POCT Influenza A/B  Result Value Ref Range    Influenza A, POC Negative Negative   Influenza B, POC Negative Negative  POCT rapid strep A  Result Value Ref Range   Rapid Strep A Screen Negative Negative      The 10-year ASCVD risk score (Arnett DK, et al., 2019) is: 13%    Assessment & Plan:   Sore throat -     POC COVID-19 BinaxNow -     POCT Influenza A/B -     POCT rapid strep A  Essential hypertension, benign    Return Call Thursday or Friday of time improving..  Testing for strep, influenza and COVID were all negative.  Continue alleviating treatments.  Please restart Zestoretic.  Information was pharyngitis.  Libby Maw, MD

## 2022-04-27 ENCOUNTER — Other Ambulatory Visit: Payer: Self-pay

## 2022-06-14 ENCOUNTER — Ambulatory Visit (INDEPENDENT_AMBULATORY_CARE_PROVIDER_SITE_OTHER): Payer: 59 | Admitting: Family Medicine

## 2022-06-14 ENCOUNTER — Other Ambulatory Visit (HOSPITAL_COMMUNITY): Payer: Self-pay

## 2022-06-14 ENCOUNTER — Encounter: Payer: Self-pay | Admitting: Family Medicine

## 2022-06-14 VITALS — BP 128/92 | HR 70 | Temp 97.9°F | Ht 71.0 in | Wt 248.2 lb

## 2022-06-14 DIAGNOSIS — I10 Essential (primary) hypertension: Secondary | ICD-10-CM

## 2022-06-14 DIAGNOSIS — J4520 Mild intermittent asthma, uncomplicated: Secondary | ICD-10-CM | POA: Diagnosis not present

## 2022-06-14 DIAGNOSIS — E7800 Pure hypercholesterolemia, unspecified: Secondary | ICD-10-CM

## 2022-06-14 DIAGNOSIS — Z0000 Encounter for general adult medical examination without abnormal findings: Secondary | ICD-10-CM

## 2022-06-14 LAB — CBC
HCT: 46 % (ref 39.0–52.0)
Hemoglobin: 15.2 g/dL (ref 13.0–17.0)
MCHC: 33 g/dL (ref 30.0–36.0)
MCV: 92.7 fl (ref 78.0–100.0)
Platelets: 253 10*3/uL (ref 150.0–400.0)
RBC: 4.96 Mil/uL (ref 4.22–5.81)
RDW: 14.4 % (ref 11.5–15.5)
WBC: 7 10*3/uL (ref 4.0–10.5)

## 2022-06-14 LAB — COMPREHENSIVE METABOLIC PANEL
ALT: 23 U/L (ref 0–53)
AST: 19 U/L (ref 0–37)
Albumin: 4.4 g/dL (ref 3.5–5.2)
Alkaline Phosphatase: 77 U/L (ref 39–117)
BUN: 12 mg/dL (ref 6–23)
CO2: 29 mEq/L (ref 19–32)
Calcium: 9.4 mg/dL (ref 8.4–10.5)
Chloride: 102 mEq/L (ref 96–112)
Creatinine, Ser: 1.21 mg/dL (ref 0.40–1.50)
GFR: 68.96 mL/min (ref >60.00)
Glucose, Bld: 84 mg/dL (ref 70–99)
Potassium: 4.1 mEq/L (ref 3.5–5.1)
Sodium: 138 mEq/L (ref 135–145)
Total Bilirubin: 0.6 mg/dL (ref 0.2–1.2)
Total Protein: 7.5 g/dL (ref 6.0–8.3)

## 2022-06-14 LAB — LIPID PANEL
Cholesterol: 191 mg/dL (ref 0–200)
HDL: 40.8 mg/dL (ref >39.00)
LDL Cholesterol: 131 mg/dL — ABNORMAL HIGH (ref 0–99)
NonHDL: 150.14
Total CHOL/HDL Ratio: 5
Triglycerides: 95 mg/dL (ref 0.0–149.0)
VLDL: 19 mg/dL (ref 0.0–40.0)

## 2022-06-14 LAB — URINALYSIS, ROUTINE W REFLEX MICROSCOPIC
Bilirubin Urine: NEGATIVE
Ketones, ur: NEGATIVE
Leukocytes,Ua: NEGATIVE
Nitrite: NEGATIVE
Specific Gravity, Urine: 1.02 (ref 1.000–1.030)
Total Protein, Urine: NEGATIVE
Urine Glucose: NEGATIVE
Urobilinogen, UA: 0.2 (ref 0.0–1.0)
WBC, UA: NONE SEEN (ref 0–?)
pH: 6 (ref 5.0–8.0)

## 2022-06-14 LAB — HEMOGLOBIN A1C: Hgb A1c MFr Bld: 5.3 % (ref 4.6–6.5)

## 2022-06-14 LAB — PSA: PSA: 1.08 ng/mL (ref 0.10–4.00)

## 2022-06-14 MED ORDER — FLUTICASONE-SALMETEROL 100-50 MCG/ACT IN AEPB
1.0000 | INHALATION_SPRAY | Freq: Two times a day (BID) | RESPIRATORY_TRACT | 3 refills | Status: AC
Start: 2022-06-14 — End: ?
  Filled 2022-06-14: qty 180, 90d supply, fill #0
  Filled 2023-01-05: qty 180, 90d supply, fill #1

## 2022-06-14 NOTE — Progress Notes (Signed)
Established Patient Office Visit   Subjective:  Patient ID: Carlos Burns, male    DOB: [birth year]  Age: 52 y.o. MRN: [id number]  Chief Complaint  Patient presents with   Annual Exam    Fasting this morning.    HPI Encounter Diagnoses  Name Primary?   Essential hypertension, benign Yes   Elevated LDL cholesterol level    Mild intermittent asthma without complication    Healthcare maintenance    Doing well.  Continues to work from home.  Exercising by walking.  Has been able to lose 10 pounds.  Has regular dental care.  Asthma controlled well with Advair.   Review of Systems  Constitutional: Negative.   HENT: Negative.    Eyes:  Negative for blurred vision, discharge and redness.  Respiratory: Negative.    Cardiovascular: Negative.   Gastrointestinal:  Negative for abdominal pain, blood in stool, constipation and melena.  Genitourinary: Negative.  Negative for dysuria, frequency, hematuria and urgency.  Musculoskeletal: Negative.  Negative for myalgias.  Skin:  Negative for rash.  Neurological:  Negative for tingling, loss of consciousness and weakness.  Endo/Heme/Allergies:  Negative for polydipsia.      06/14/2022    8:49 AM 03/22/2022    9:55 AM 09/07/2021    9:27 AM  Depression screen PHQ 2/9  Decreased Interest 0 0 0  Down, Depressed, Hopeless 0 0 0  PHQ - 2 Score 0 0 0  Difficult doing work/chores   Not difficult at all       Current Outpatient Medications:    albuterol (PROAIR HFA) 108 (90 Base) MCG/ACT inhaler, Inhale 2 puffs into the lungs every 6 (six) hours as needed for wheezing or shortness of breath., Disp: 18 g, Rfl: 2   cetirizine (ZYRTEC) 10 MG tablet, Take 10 mg by mouth as needed. , Disp: , Rfl:    chlorhexidine (PERIDEX) 0.12 % solution, Rinse with 15 mLs  for 30 seconds and expectorate twice a day, Disp: 473 mL, Rfl: 2   fluticasone-salmeterol (ADVAIR) 100-50 MCG/ACT AEPB, Inhale 1 puff into the lungs 2 (two) times daily., Disp: 180 each,  Rfl: 3   lisinopril-hydrochlorothiazide (ZESTORETIC) 10-12.5 MG tablet, Take 1 tablet by mouth daily., Disp: 90 tablet, Rfl: 3   Objective:     BP (!) 128/92   Pulse 70   Temp 97.9 F (36.6 C) (Temporal)   Ht 5\' 11"  (1.803 m)   Wt 248 lb 3.2 oz (112.6 kg)   SpO2 97%   BMI 34.62 kg/m  BP Readings from Last 3 Encounters:  06/14/22 (!) 128/92  03/22/22 (!) 148/92  10/27/21 124/72   Wt Readings from Last 3 Encounters:  06/14/22 248 lb 3.2 oz (112.6 kg)  03/22/22 258 lb 2 oz (117.1 kg)  10/27/21 250 lb 8 oz (113.6 kg)      Physical Exam Constitutional:      General: He is not in acute distress.    Appearance: Normal appearance. He is not ill-appearing, toxic-appearing or diaphoretic.  HENT:     Head: Normocephalic and atraumatic.     Right Ear: Tympanic membrane, ear canal and external ear normal.     Left Ear: Tympanic membrane, ear canal and external ear normal.     Mouth/Throat:     Mouth: Mucous membranes are moist.     Pharynx: Oropharynx is clear. No oropharyngeal exudate or posterior oropharyngeal erythema.  Eyes:     General: No scleral icterus.       Right eye:  No discharge.        Left eye: No discharge.     Extraocular Movements: Extraocular movements intact.     Conjunctiva/sclera: Conjunctivae normal.     Pupils: Pupils are equal, round, and reactive to light.  Cardiovascular:     Rate and Rhythm: Normal rate and regular rhythm.  Pulmonary:     Effort: Pulmonary effort is normal. No respiratory distress.     Breath sounds: Normal breath sounds.  Abdominal:     General: Bowel sounds are normal.     Tenderness: There is no abdominal tenderness. There is no guarding or rebound.  Musculoskeletal:     Cervical back: No rigidity or tenderness.  Skin:    General: Skin is warm and dry.  Neurological:     Mental Status: He is alert and oriented to person, place, and time.  Psychiatric:        Mood and Affect: Mood normal.        Behavior: Behavior normal.       No results found for any visits on 06/14/22.    The 10-year ASCVD risk score (Arnett DK, et al., 2019) is: 10%    Assessment & Plan:   Essential hypertension, benign -     CBC -     Comprehensive metabolic panel -     Urinalysis, Routine w reflex microscopic  Elevated LDL cholesterol level -     Comprehensive metabolic panel -     Lipid panel  Mild intermittent asthma without complication -     Fluticasone-Salmeterol; Inhale 1 puff into the lungs 2 (two) times daily.  Dispense: 180 each; Refill: 3  Healthcare maintenance -     Hemoglobin A1c -     PSA    Return in about 3 months (around 09/14/2022).  Continue weight loss efforts.  Blood pressure should continue to come down.  Continue current medications.  Mliss Sax, MD

## 2022-06-17 ENCOUNTER — Other Ambulatory Visit (HOSPITAL_COMMUNITY): Payer: Self-pay

## 2022-08-26 ENCOUNTER — Encounter (INDEPENDENT_AMBULATORY_CARE_PROVIDER_SITE_OTHER): Payer: Self-pay

## 2022-09-14 ENCOUNTER — Encounter: Payer: Self-pay | Admitting: Pharmacist

## 2022-09-20 ENCOUNTER — Other Ambulatory Visit (HOSPITAL_COMMUNITY): Payer: Self-pay

## 2022-09-20 ENCOUNTER — Ambulatory Visit (INDEPENDENT_AMBULATORY_CARE_PROVIDER_SITE_OTHER): Payer: 59 | Admitting: Family Medicine

## 2022-09-20 ENCOUNTER — Encounter: Payer: Self-pay | Admitting: Family Medicine

## 2022-09-20 VITALS — BP 120/82 | HR 62 | Temp 97.8°F | Resp 19 | Ht 71.0 in | Wt 249.2 lb

## 2022-09-20 DIAGNOSIS — Z23 Encounter for immunization: Secondary | ICD-10-CM | POA: Diagnosis not present

## 2022-09-20 DIAGNOSIS — H60503 Unspecified acute noninfective otitis externa, bilateral: Secondary | ICD-10-CM | POA: Insufficient documentation

## 2022-09-20 DIAGNOSIS — I10 Essential (primary) hypertension: Secondary | ICD-10-CM | POA: Diagnosis not present

## 2022-09-20 LAB — BASIC METABOLIC PANEL
BUN: 13 mg/dL (ref 6–23)
CO2: 29 meq/L (ref 19–32)
Calcium: 9.3 mg/dL (ref 8.4–10.5)
Chloride: 102 meq/L (ref 96–112)
Creatinine, Ser: 1.2 mg/dL (ref 0.40–1.50)
GFR: 69.52 mL/min (ref >60.00)
Glucose, Bld: 79 mg/dL (ref 70–99)
Potassium: 3.8 meq/L (ref 3.5–5.1)
Sodium: 139 meq/L (ref 135–145)

## 2022-09-20 MED ORDER — NEOMYCIN-POLYMYXIN-HC 3.5-10000-1 OT SUSP
3.0000 [drp] | Freq: Three times a day (TID) | OTIC | 0 refills | Status: AC
Start: 2022-09-20 — End: 2022-09-28
  Filled 2022-09-20: qty 10, 7d supply, fill #0

## 2022-09-20 MED ORDER — LISINOPRIL-HYDROCHLOROTHIAZIDE 10-12.5 MG PO TABS
1.0000 | ORAL_TABLET | Freq: Every day | ORAL | 3 refills | Status: DC
Start: 2022-09-20 — End: 2023-08-08
  Filled 2022-09-20: qty 90, 90d supply, fill #0
  Filled 2023-01-05: qty 90, 90d supply, fill #1
  Filled 2023-06-21: qty 90, 90d supply, fill #2

## 2022-09-20 NOTE — Progress Notes (Signed)
Established Patient Office Visit   Subjective:  Patient ID: Carlos Burns, male    DOB: 03/30/1970  Age: 52 y.o. MRN: [id number]  Chief Complaint  Patient presents with   Follow-up    R ear pain onset 2 weeks ago  Flu shot     HPI Encounter Diagnoses  Name Primary?   Acute otitis externa of both ears, unspecified type Yes   Need for influenza vaccination    Essential hypertension, benign    For follow-up of hypertension.  Doing well with his Zestoretic.  No issues taking blood pressures been well-controlled.  Used a new set of air buds and developed soreness in his ear canals after use.  Hearing is not affected.  No URI signs and symptoms.   Review of Systems  Constitutional: Negative.   HENT:  Positive for ear pain. Negative for congestion, ear discharge and hearing loss.   Eyes:  Negative for blurred vision, discharge and redness.  Respiratory: Negative.    Cardiovascular: Negative.   Gastrointestinal:  Negative for abdominal pain.  Genitourinary: Negative.   Musculoskeletal: Negative.  Negative for myalgias.  Skin:  Negative for rash.  Neurological:  Negative for tingling, loss of consciousness and weakness.  Endo/Heme/Allergies:  Negative for polydipsia.     Current Outpatient Medications:    albuterol (PROAIR HFA) 108 (90 Base) MCG/ACT inhaler, Inhale 2 puffs into the lungs every 6 (six) hours as needed for wheezing or shortness of breath., Disp: 18 g, Rfl: 2   cetirizine (ZYRTEC) 10 MG tablet, Take 10 mg by mouth as needed. , Disp: , Rfl:    chlorhexidine (PERIDEX) 0.12 % solution, Rinse with 15 mLs  for 30 seconds and expectorate twice a day, Disp: 473 mL, Rfl: 2   fluticasone-salmeterol (ADVAIR) 100-50 MCG/ACT AEPB, Inhale 1 puff into the lungs 2 (two) times daily., Disp: 180 each, Rfl: 3   neomycin-polymyxin-hydrocortisone (CORTISPORIN) 3.5-10000-1 OTIC suspension, Place 3 drops into both ears 3 (three) times daily for 7 days., Disp: 10 mL, Rfl: 0    lisinopril-hydrochlorothiazide (ZESTORETIC) 10-12.5 MG tablet, Take 1 tablet by mouth daily., Disp: 90 tablet, Rfl: 3   Objective:     BP 120/82 (BP Location: Left Arm, Patient Position: Sitting, Cuff Size: Large)   Pulse 62   Temp 97.8 F (36.6 C) (Temporal)   Resp 19   Ht 5\' 11"  (1.803 m)   Wt 249 lb 3.2 oz (113 kg)   SpO2 98%   BMI 34.76 kg/m  BP Readings from Last 3 Encounters:  09/20/22 120/82  06/14/22 (!) 128/92  03/22/22 (!) 148/92   Wt Readings from Last 3 Encounters:  09/20/22 249 lb 3.2 oz (113 kg)  06/14/22 248 lb 3.2 oz (112.6 kg)  03/22/22 258 lb 2 oz (117.1 kg)      Physical Exam Constitutional:      General: He is not in acute distress.    Appearance: Normal appearance. He is not ill-appearing, toxic-appearing or diaphoretic.  HENT:     Head: Normocephalic and atraumatic.     Right Ear: Tympanic membrane and external ear normal.     Left Ear: Tympanic membrane and external ear normal.     Ears:     Comments: Both canals are erythematous.  There is no scaling or cracking.  There is no discharge. Eyes:     General: No scleral icterus.       Right eye: No discharge.        Left eye: No discharge.  Extraocular Movements: Extraocular movements intact.     Conjunctiva/sclera: Conjunctivae normal.  Cardiovascular:     Rate and Rhythm: Normal rate and regular rhythm.  Pulmonary:     Effort: Pulmonary effort is normal. No respiratory distress.     Breath sounds: Normal breath sounds.  Musculoskeletal:     Cervical back: No rigidity or tenderness.  Skin:    General: Skin is warm and dry.  Neurological:     Mental Status: He is alert and oriented to person, place, and time.  Psychiatric:        Mood and Affect: Mood normal.        Behavior: Behavior normal.      No results found for any visits on 09/20/22.    The 10-year ASCVD risk score (Arnett DK, et al., 2019) is: 9%    Assessment & Plan:   Acute otitis externa of both ears, unspecified  type -     Neomycin-Polymyxin-HC; Place 3 drops into both ears 3 (three) times daily for 7 days.  Dispense: 10 mL; Refill: 0  Need for influenza vaccination -     Flu vaccine trivalent PF, 6mos and older(Flulaval,Afluria,Fluarix,Fluzone)  Essential hypertension, benign -     Basic metabolic panel -     Lisinopril-hydroCHLOROthiazide; Take 1 tablet by mouth daily.  Dispense: 90 tablet; Refill: 3    Return in about 11 months (around 08/20/2023), or if symptoms worsen or fail to improve, for annual physical.    Mliss Sax, MD

## 2022-09-21 ENCOUNTER — Other Ambulatory Visit (HOSPITAL_COMMUNITY): Payer: Self-pay

## 2023-01-06 ENCOUNTER — Other Ambulatory Visit (HOSPITAL_COMMUNITY): Payer: Self-pay

## 2023-06-21 ENCOUNTER — Other Ambulatory Visit (HOSPITAL_COMMUNITY): Payer: Self-pay

## 2023-07-05 IMAGING — MR MR HEAD WO/W CM
14 of 16 series · 44 of 48 positions shown · IV contrast (gadavist)
Comparison: None.

CLINICAL DATA: Demyelinating disease. ? Demyelinating disease
Diffuse whole body pins and needles sensation x 1-2 months; symptoms
started [DATE]; worse over the last 2 months with new numbness in
fingers and toes, no other symptoms

EXAM:
MRI HEAD WITHOUT AND WITH CONTRAST
TECHNIQUE: Multiplanar, multiecho pulse sequences of the brain and surrounding
structures were obtained without and with intravenous contrast.
CONTRAST:  10mL GADAVIST GADOBUTROL 1 MMOL/ML IV SOLN

[Series 2: DWI · axial · 3.0mm · 2.19mm/px · z∈[-56,+99]mm · 5 of 96 slices shown (1 of 2)]
[im 1/96]
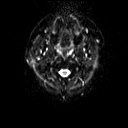
[im 24/96]
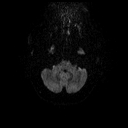
[im 48/96]
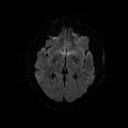
[im 72/96]
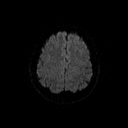
[im 96/96]
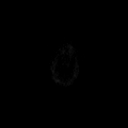

[Series 3: DWI · axial · 3.0mm · 2.19mm/px · z∈[-56,+99]mm · 3 of 48 slices shown (2 of 2)]
[im 1/48]
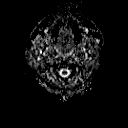
[im 24/48]
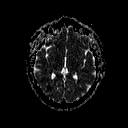
[im 48/48]
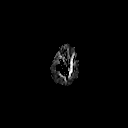

[Series 4: T1 · sagittal · 5.0mm · 0.47mm/px · 1 of 25 slices shown]
[im 1/25]
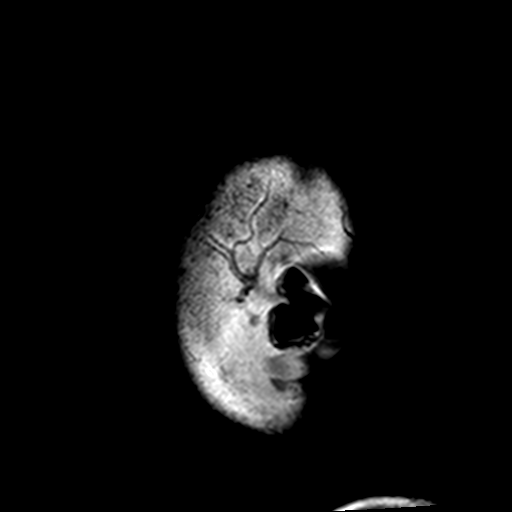

[Series 5: FLAIR · sagittal · 4.0mm · 0.47mm/px · 1 of 34 slices shown (1 of 2)]
[im 1/34]
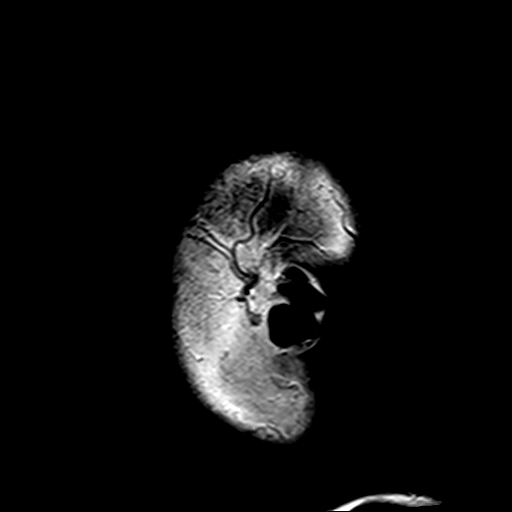

[Series 6: FLAIR · axial · 3.0mm · 0.45mm/px · 1 of 28 slices shown (2 of 2)]
[im 1/28]
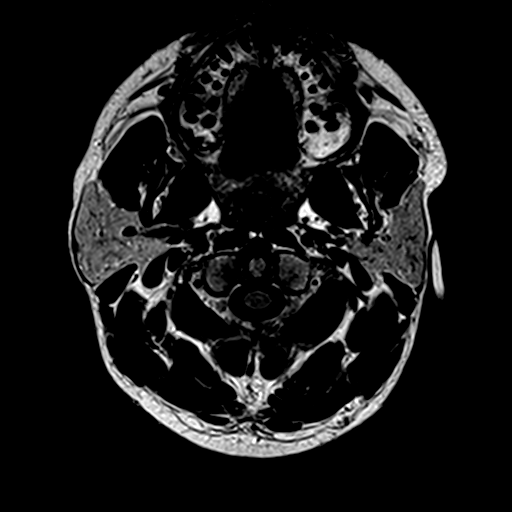

[Series 7: T2 · axial · 5.0mm · 0.45mm/px · 1 of 26 slices shown (1 of 2)]
[im 1/26]
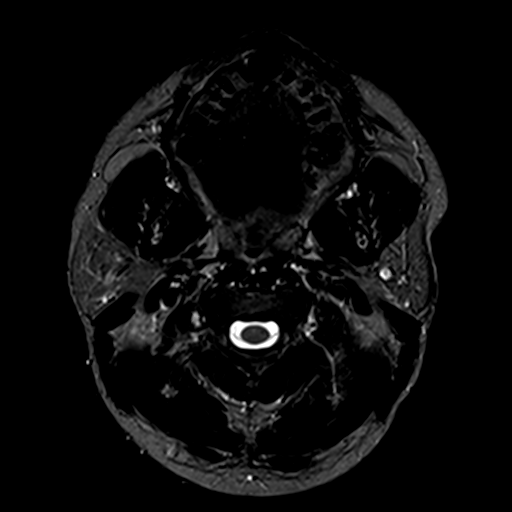

[Series 8: T2 · axial · 5.0mm · 0.45mm/px · 1 of 26 slices shown (2 of 2)]
[im 1/26]
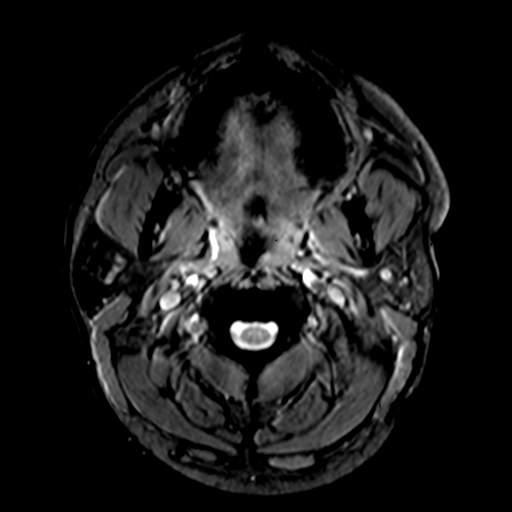

[Series 10: t1_mprage_tra · axial · 1.0mm · 0.90mm/px · z∈[-65,+110]mm · 7 of 176 slices shown]
[im 1/176]
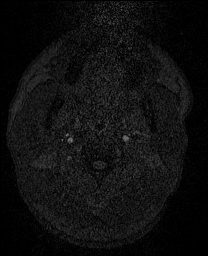
[im 30/176]
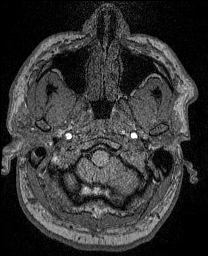
[im 59/176]
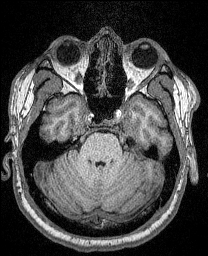
[im 88/176]
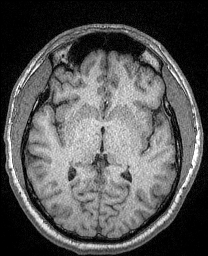
[im 117/176]
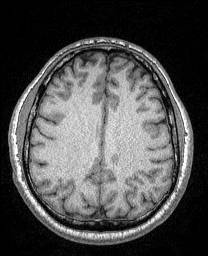
[im 146/176]
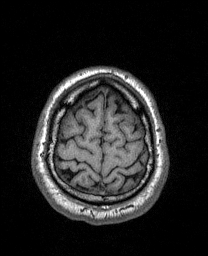
[im 176/176]
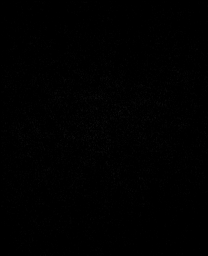

[Series 11: T2 post-contrast · coronal · 5.0mm · 0.47mm/px · 1 of 28 slices shown]
[im 1/28]
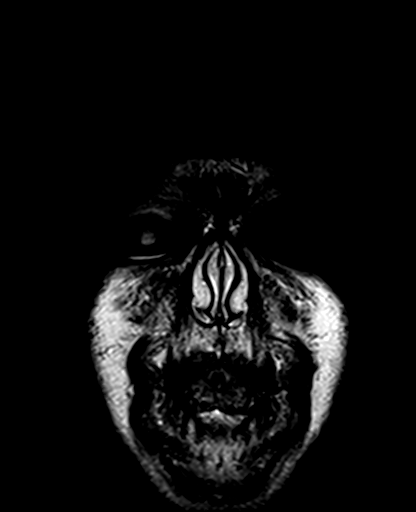

[Series 13: t1_mprage_tra post · axial · 1.0mm · 0.90mm/px · z∈[-65,+110]mm · 7 of 176 slices shown]
[im 1/176]
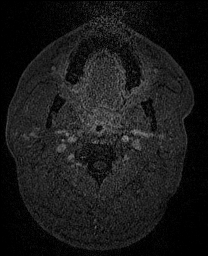
[im 30/176]
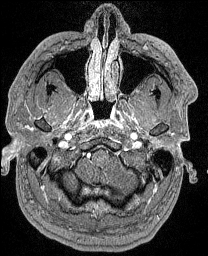
[im 59/176]
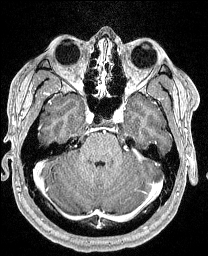
[im 88/176]
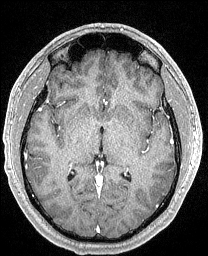
[im 117/176]
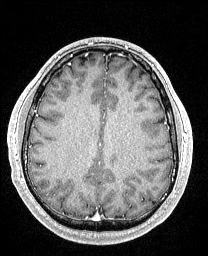
[im 146/176]
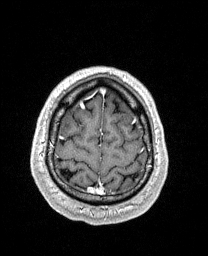
[im 176/176]
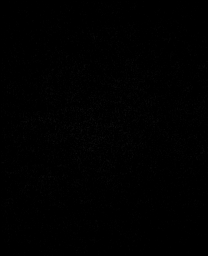

[Series 101: sag mpr pre · axial · non-contrast · 1.0mm · 0.48mm/px · z∈[+22,+137]mm · 4 of 106 slices shown]
[im 1/106]
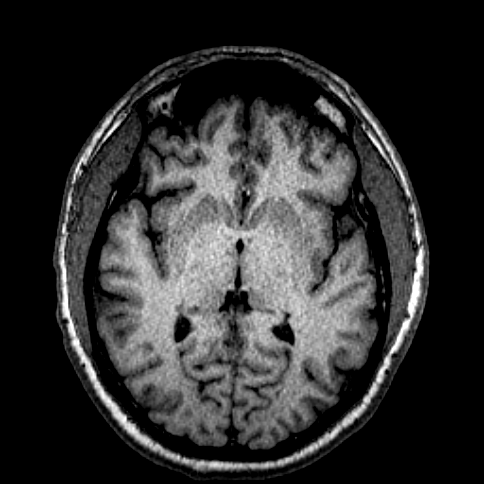
[im 36/106]
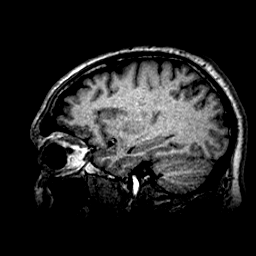
[im 71/106]
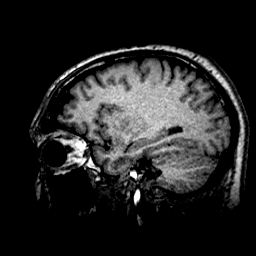
[im 106/106]
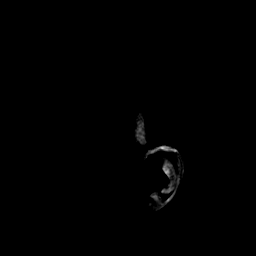

[Series 102: cor mpr pre · axial · non-contrast · 1.0mm · 0.48mm/px · z∈[+22,+136]mm · 4 of 106 slices shown]
[im 1/106]
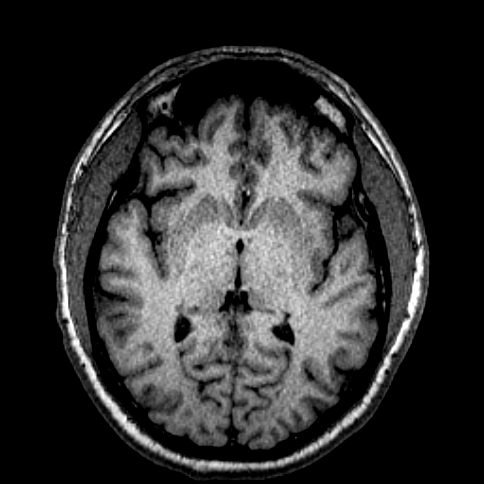
[im 36/106]
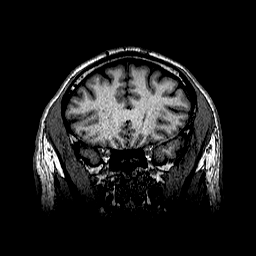
[im 71/106]
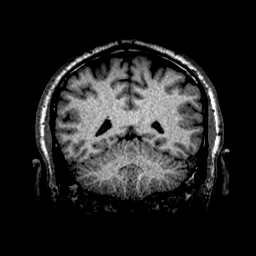
[im 106/106]
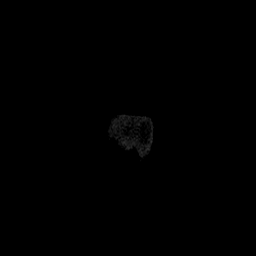

[Series 103: sag mpr post · axial · 1.0mm · 0.48mm/px · z∈[+22,+137]mm · 4 of 106 slices shown]
[im 1/106]
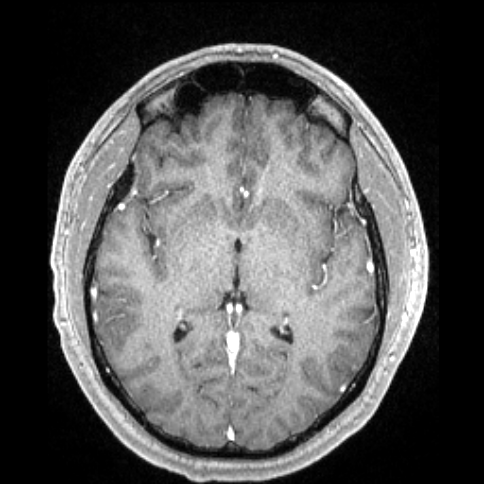
[im 36/106]
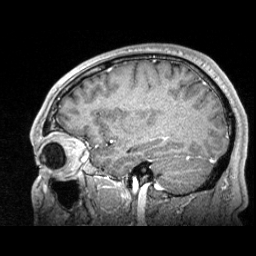
[im 71/106]
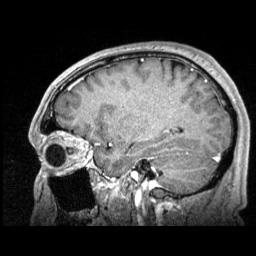
[im 106/106]
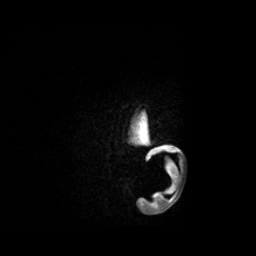

[Series 104: cor mpr post · axial · 1.0mm · 0.48mm/px · z∈[+22,+136]mm · 4 of 106 slices shown]
[im 1/106]
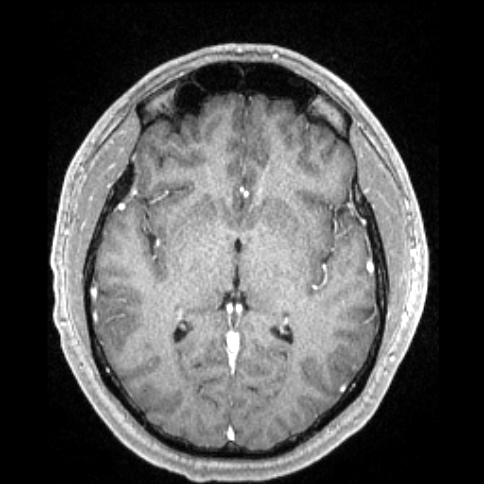
[im 36/106]
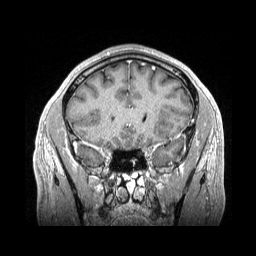
[im 71/106]
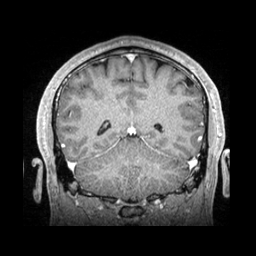
[im 106/106]
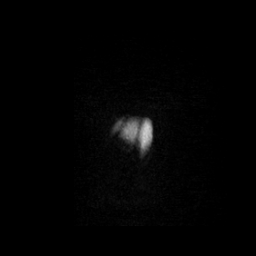

[44 of 48 positions shown; findings below may reference images not displayed]

FINDINGS: Brain: No acute infarct, mass effect or extra-axial collection. No
acute or chronic hemorrhage. Normal white matter signal, parenchymal
volume and CSF spaces. The midline structures are normal.

Vascular: Major flow voids are preserved.

Skull and upper cervical spine: Normal calvarium and skull base.
Visualized upper cervical spine and soft tissues are normal.

Sinuses/Orbits:No paranasal sinus fluid levels or advanced mucosal
thickening. No mastoid or middle ear effusion. Normal orbits.
IMPRESSION: Normal brain MRI.

## 2023-08-08 ENCOUNTER — Ambulatory Visit (INDEPENDENT_AMBULATORY_CARE_PROVIDER_SITE_OTHER): Payer: 59 | Admitting: Family Medicine

## 2023-08-08 ENCOUNTER — Other Ambulatory Visit (HOSPITAL_COMMUNITY): Payer: Self-pay

## 2023-08-08 ENCOUNTER — Encounter: Payer: Self-pay | Admitting: Family Medicine

## 2023-08-08 VITALS — BP 122/80 | HR 67 | Temp 97.7°F | Ht 71.0 in | Wt 241.0 lb

## 2023-08-08 DIAGNOSIS — Z131 Encounter for screening for diabetes mellitus: Secondary | ICD-10-CM | POA: Diagnosis not present

## 2023-08-08 DIAGNOSIS — Z0000 Encounter for general adult medical examination without abnormal findings: Secondary | ICD-10-CM

## 2023-08-08 DIAGNOSIS — Z13220 Encounter for screening for lipoid disorders: Secondary | ICD-10-CM

## 2023-08-08 DIAGNOSIS — Z23 Encounter for immunization: Secondary | ICD-10-CM

## 2023-08-08 DIAGNOSIS — Z125 Encounter for screening for malignant neoplasm of prostate: Secondary | ICD-10-CM

## 2023-08-08 DIAGNOSIS — I10 Essential (primary) hypertension: Secondary | ICD-10-CM | POA: Diagnosis not present

## 2023-08-08 LAB — COMPREHENSIVE METABOLIC PANEL WITH GFR
ALT: 22 U/L (ref 0–53)
AST: 18 U/L (ref 0–37)
Albumin: 4.5 g/dL (ref 3.5–5.2)
Alkaline Phosphatase: 76 U/L (ref 39–117)
BUN: 10 mg/dL (ref 6–23)
CO2: 31 meq/L (ref 19–32)
Calcium: 9.3 mg/dL (ref 8.4–10.5)
Chloride: 101 meq/L (ref 96–112)
Creatinine, Ser: 1.28 mg/dL (ref 0.40–1.50)
GFR: 63.94 mL/min (ref >60.00)
Glucose, Bld: 90 mg/dL (ref 70–99)
Potassium: 3.8 meq/L (ref 3.5–5.1)
Sodium: 139 meq/L (ref 135–145)
Total Bilirubin: 0.8 mg/dL (ref 0.2–1.2)
Total Protein: 7.4 g/dL (ref 6.0–8.3)

## 2023-08-08 LAB — LIPID PANEL
Cholesterol: 189 mg/dL (ref 0–200)
HDL: 40.1 mg/dL (ref >39.00)
LDL Cholesterol: 128 mg/dL — ABNORMAL HIGH (ref 0–99)
NonHDL: 148.84
Total CHOL/HDL Ratio: 5
Triglycerides: 106 mg/dL (ref 0.0–149.0)
VLDL: 21.2 mg/dL (ref 0.0–40.0)

## 2023-08-08 LAB — URINALYSIS, ROUTINE W REFLEX MICROSCOPIC
Bilirubin Urine: NEGATIVE
Ketones, ur: NEGATIVE
Leukocytes,Ua: NEGATIVE
Nitrite: NEGATIVE
Specific Gravity, Urine: 1.01 (ref 1.000–1.030)
Total Protein, Urine: NEGATIVE
Urine Glucose: NEGATIVE
Urobilinogen, UA: 0.2 (ref 0.0–1.0)
WBC, UA: NONE SEEN (ref 0–?)
pH: 6 (ref 5.0–8.0)

## 2023-08-08 LAB — CBC WITH DIFFERENTIAL/PLATELET
Basophils Absolute: 0 K/uL (ref 0.0–0.1)
Basophils Relative: 0.3 % (ref 0.0–3.0)
Eosinophils Absolute: 0.1 K/uL (ref 0.0–0.7)
Eosinophils Relative: 1.6 % (ref 0.0–5.0)
HCT: 45.3 % (ref 39.0–52.0)
Hemoglobin: 15.1 g/dL (ref 13.0–17.0)
Lymphocytes Relative: 19.7 % (ref 12.0–46.0)
Lymphs Abs: 1.5 K/uL (ref 0.7–4.0)
MCHC: 33.3 g/dL (ref 30.0–36.0)
MCV: 92.5 fl (ref 78.0–100.0)
Monocytes Absolute: 0.5 K/uL (ref 0.1–1.0)
Monocytes Relative: 6.3 % (ref 3.0–12.0)
Neutro Abs: 5.3 K/uL (ref 1.4–7.7)
Neutrophils Relative %: 72.1 % (ref 43.0–77.0)
Platelets: 249 K/uL (ref 150.0–400.0)
RBC: 4.9 Mil/uL (ref 4.22–5.81)
RDW: 14.3 % (ref 11.5–15.5)
WBC: 7.4 K/uL (ref 4.0–10.5)

## 2023-08-08 LAB — HEMOGLOBIN A1C: Hgb A1c MFr Bld: 5.6 % (ref 4.6–6.5)

## 2023-08-08 MED ORDER — LISINOPRIL-HYDROCHLOROTHIAZIDE 10-12.5 MG PO TABS
1.0000 | ORAL_TABLET | Freq: Every day | ORAL | 3 refills | Status: AC
  Filled 2023-08-08 – 2023-09-29 (×2): qty 90, 90d supply, fill #0

## 2023-08-08 NOTE — Progress Notes (Signed)
 Established Patient Office Visit   Subjective:  Patient ID: Carlos Burns, male    DOB: [birth year]  Age: 53 y.o. MRN: [id number]  Chief Complaint  Patient presents with   Annual Exam    CPE. Pt is fasting. BP dropped dizzy 90/63    HPI Encounter Diagnoses  Name Primary?   Healthcare maintenance Yes   Screening for prostate cancer    Screening for cholesterol level    Screening for diabetes mellitus    Immunization due    Essential hypertension, benign    For physical and follow-up of the above.  Has been able to lose about 10 pounds.  He is exercising regularly with regular dental care.  Continues Zestoretic  without issue.  However, he did get lightheaded after he been outside in heat for a while.  He has since hydrated well and his blood pressures been running in the 120s over 70s.   Review of Systems  Constitutional: Negative.   HENT: Negative.    Eyes:  Negative for blurred vision, discharge and redness.  Respiratory: Negative.    Cardiovascular: Negative.   Gastrointestinal:  Negative for abdominal pain.  Genitourinary: Negative.   Musculoskeletal: Negative.  Negative for myalgias.  Skin:  Negative for rash.  Neurological:  Negative for tingling, loss of consciousness and weakness.  Endo/Heme/Allergies:  Negative for polydipsia.      08/08/2023    8:59 AM 08/08/2023    8:58 AM 06/14/2022    8:49 AM  Depression screen PHQ 2/9  Decreased Interest 0 0 0  Down, Depressed, Hopeless 0 0 0  PHQ - 2 Score 0 0 0  Altered sleeping 0 0   Tired, decreased energy 0 0   Change in appetite 0 0   Feeling bad or failure about yourself  0 0   Trouble concentrating 0 0   Moving slowly or fidgety/restless 0 0   Suicidal thoughts 0 0   PHQ-9 Score 0 0   Difficult doing work/chores Not difficult at all Not difficult at all       Current Outpatient Medications:    albuterol  (PROAIR  HFA) 108 (90 Base) MCG/ACT inhaler, Inhale 2 puffs into the lungs every 6 (six) hours as  needed for wheezing or shortness of breath., Disp: 18 g, Rfl: 2   cetirizine (ZYRTEC) 10 MG tablet, Take 10 mg by mouth as needed. , Disp: , Rfl:    fluticasone -salmeterol (ADVAIR ) 100-50 MCG/ACT AEPB, Inhale 1 puff into the lungs 2 (two) times daily., Disp: 180 each, Rfl: 3   chlorhexidine  (PERIDEX ) 0.12 % solution, Rinse with 15 mLs  for 30 seconds and expectorate twice a day (Patient not taking: Reported on 08/08/2023), Disp: 473 mL, Rfl: 2   lisinopril -hydrochlorothiazide  (ZESTORETIC ) 10-12.5 MG tablet, Take 1 tablet by mouth daily., Disp: 90 tablet, Rfl: 3   Objective:     BP 122/80 (BP Location: Right Arm, Patient Position: Sitting, Cuff Size: Normal)   Pulse 67   Temp 97.7 F (36.5 C) (Temporal)   Ht 5' 11 (1.803 m)   Wt 241 lb (109.3 kg)   SpO2 97%   BMI 33.61 kg/m  BP Readings from Last 3 Encounters:  08/08/23 122/80  09/20/22 120/82  06/14/22 (!) 128/92   Wt Readings from Last 3 Encounters:  08/08/23 241 lb (109.3 kg)  09/20/22 249 lb 3.2 oz (113 kg)  06/14/22 248 lb 3.2 oz (112.6 kg)      Physical Exam Constitutional:      General: He  is not in acute distress.    Appearance: Normal appearance. He is not ill-appearing, toxic-appearing or diaphoretic.  HENT:     Head: Normocephalic and atraumatic.     Right Ear: Tympanic membrane, ear canal and external ear normal.     Left Ear: Tympanic membrane, ear canal and external ear normal.     Mouth/Throat:     Mouth: Mucous membranes are moist.     Pharynx: Oropharynx is clear. No oropharyngeal exudate or posterior oropharyngeal erythema.  Eyes:     General: No scleral icterus.       Right eye: No discharge.        Left eye: No discharge.     Extraocular Movements: Extraocular movements intact.     Conjunctiva/sclera: Conjunctivae normal.     Pupils: Pupils are equal, round, and reactive to light.  Cardiovascular:     Rate and Rhythm: Normal rate and regular rhythm.  Pulmonary:     Effort: Pulmonary effort is  normal. No respiratory distress.     Breath sounds: Normal breath sounds. No wheezing or rales.  Abdominal:     General: Bowel sounds are normal.     Tenderness: There is no abdominal tenderness. There is no guarding or rebound.     Hernia: There is no hernia in the left inguinal area or right inguinal area.  Genitourinary:    Penis: Circumcised. No hypospadias, erythema, tenderness, discharge, swelling or lesions.      Testes:        Right: Mass, tenderness or swelling not present. Right testis is descended.        Left: Mass, tenderness or swelling not present. Left testis is descended.     Epididymis:     Right: Not inflamed or enlarged.     Left: Not inflamed or enlarged.  Musculoskeletal:     Cervical back: No rigidity or tenderness.  Lymphadenopathy:     Lower Body: No right inguinal adenopathy. No left inguinal adenopathy.  Skin:    General: Skin is warm and dry.  Neurological:     Mental Status: He is alert and oriented to person, place, and time.  Psychiatric:        Mood and Affect: Mood normal.        Behavior: Behavior normal.      No results found for any visits on 08/08/23.    The 10-year ASCVD risk score (Arnett DK, et al., 2019) is: 9.6%    Assessment & Plan:   Healthcare maintenance -     CBC with Differential/Platelet -     Urinalysis, Routine w reflex microscopic  Screening for prostate cancer -     PSA  Screening for cholesterol level -     Comprehensive metabolic panel with GFR -     Lipid panel  Screening for diabetes mellitus -     Comprehensive metabolic panel with GFR -     Hemoglobin A1c  Immunization due -     Tdap vaccine greater than or equal to 7yo IM  Essential hypertension, benign -     Lisinopril -hydroCHLOROthiazide ; Take 1 tablet by mouth daily.  Dispense: 90 tablet; Refill: 3    Return in about 1 year (around 08/07/2024), or if symptoms worsen or fail to improve.  Continue weight loss efforts and regular exercise.   Information was given on managing hypertension.  Information given on health maintenance and disease prevention.  Elsie Sim Lent, MD

## 2023-08-09 ENCOUNTER — Ambulatory Visit: Payer: Self-pay | Admitting: Family Medicine

## 2023-08-09 LAB — PSA: PSA: 1.09 ng/mL (ref 0.10–4.00)

## 2023-09-29 ENCOUNTER — Other Ambulatory Visit: Payer: Self-pay

## 2023-09-29 ENCOUNTER — Other Ambulatory Visit: Payer: Self-pay | Admitting: Family Medicine

## 2023-09-29 ENCOUNTER — Other Ambulatory Visit (HOSPITAL_COMMUNITY): Payer: Self-pay

## 2023-09-29 DIAGNOSIS — J4520 Mild intermittent asthma, uncomplicated: Secondary | ICD-10-CM

## 2023-09-30 ENCOUNTER — Other Ambulatory Visit (HOSPITAL_COMMUNITY): Payer: Self-pay

## 2023-09-30 MED ORDER — ALBUTEROL SULFATE HFA 108 (90 BASE) MCG/ACT IN AERS
2.0000 | INHALATION_SPRAY | Freq: Four times a day (QID) | RESPIRATORY_TRACT | 2 refills | Status: AC | PRN
  Filled 2023-09-30: qty 6.7, 25d supply, fill #0

## 2024-09-04 ENCOUNTER — Encounter: Admitting: Family Medicine
